# Patient Record
Sex: Male | Born: 1935 | Race: White | Hispanic: No | Marital: Married | Smoking: Former smoker
Health system: Southern US, Community
[De-identification: ages and names within clinical notes are randomized; demographics above are authoritative.]

## PROBLEM LIST (undated history)

## (undated) DIAGNOSIS — Z8639 Personal history of other endocrine, nutritional and metabolic disease: Secondary | ICD-10-CM

## (undated) DIAGNOSIS — I447 Left bundle-branch block, unspecified: Secondary | ICD-10-CM

## (undated) DIAGNOSIS — I6523 Occlusion and stenosis of bilateral carotid arteries: Secondary | ICD-10-CM

## (undated) DIAGNOSIS — I428 Other cardiomyopathies: Secondary | ICD-10-CM

## (undated) DIAGNOSIS — G629 Polyneuropathy, unspecified: Secondary | ICD-10-CM

## (undated) DIAGNOSIS — E782 Mixed hyperlipidemia: Secondary | ICD-10-CM

## (undated) DIAGNOSIS — K648 Other hemorrhoids: Secondary | ICD-10-CM

## (undated) DIAGNOSIS — Z973 Presence of spectacles and contact lenses: Secondary | ICD-10-CM

## (undated) DIAGNOSIS — M5030 Other cervical disc degeneration, unspecified cervical region: Secondary | ICD-10-CM

## (undated) DIAGNOSIS — N529 Male erectile dysfunction, unspecified: Secondary | ICD-10-CM

## (undated) DIAGNOSIS — I2510 Atherosclerotic heart disease of native coronary artery without angina pectoris: Secondary | ICD-10-CM

## (undated) DIAGNOSIS — Z7901 Long term (current) use of anticoagulants: Secondary | ICD-10-CM

## (undated) DIAGNOSIS — M1990 Unspecified osteoarthritis, unspecified site: Secondary | ICD-10-CM

## (undated) DIAGNOSIS — I10 Essential (primary) hypertension: Secondary | ICD-10-CM

## (undated) DIAGNOSIS — N401 Enlarged prostate with lower urinary tract symptoms: Secondary | ICD-10-CM

## (undated) DIAGNOSIS — E039 Hypothyroidism, unspecified: Secondary | ICD-10-CM

## (undated) DIAGNOSIS — I5022 Chronic systolic (congestive) heart failure: Secondary | ICD-10-CM

## (undated) DIAGNOSIS — Z85828 Personal history of other malignant neoplasm of skin: Secondary | ICD-10-CM

## (undated) DIAGNOSIS — M5412 Radiculopathy, cervical region: Secondary | ICD-10-CM

## (undated) DIAGNOSIS — Z87442 Personal history of urinary calculi: Secondary | ICD-10-CM

## (undated) DIAGNOSIS — H9190 Unspecified hearing loss, unspecified ear: Secondary | ICD-10-CM

## (undated) DIAGNOSIS — I480 Paroxysmal atrial fibrillation: Secondary | ICD-10-CM

## (undated) DIAGNOSIS — G4700 Insomnia, unspecified: Secondary | ICD-10-CM

## (undated) HISTORY — DX: Other cervical disc degeneration, unspecified cervical region: M50.30

## (undated) HISTORY — DX: Radiculopathy, cervical region: M54.12

## (undated) HISTORY — DX: Insomnia, unspecified: G47.00

## (undated) HISTORY — PX: HEMORRHOID SURGERY: SHX153

## (undated) HISTORY — DX: Polyneuropathy, unspecified: G62.9

## (undated) HISTORY — DX: Unspecified hearing loss, unspecified ear: H91.90

## (undated) HISTORY — DX: Essential (primary) hypertension: I10

## (undated) HISTORY — PX: HAND SURGERY: SHX662

## (undated) HISTORY — PX: TONSILLECTOMY AND ADENOIDECTOMY: SUR1326

## (undated) HISTORY — DX: Mixed hyperlipidemia: E78.2

## (undated) HISTORY — DX: Other hemorrhoids: K64.8

## (undated) HISTORY — DX: Left bundle-branch block, unspecified: I44.7

## (undated) HISTORY — PX: TOTAL KNEE ARTHROPLASTY: SHX125

## (undated) HISTORY — DX: Unspecified osteoarthritis, unspecified site: M19.90

---

## 1996-12-30 HISTORY — PX: KNEE ARTHROSCOPY: SUR90

## 1997-12-30 HISTORY — PX: LUMBAR DISC SURGERY: SHX700

## 2003-04-01 ENCOUNTER — Ambulatory Visit (HOSPITAL_COMMUNITY): Admission: RE | Admit: 2003-04-01 | Discharge: 2003-04-01 | Payer: Self-pay | Admitting: Gastroenterology

## 2003-04-01 ENCOUNTER — Encounter (INDEPENDENT_AMBULATORY_CARE_PROVIDER_SITE_OTHER): Payer: Self-pay | Admitting: Specialist

## 2003-05-19 ENCOUNTER — Encounter: Payer: Self-pay | Admitting: General Surgery

## 2003-05-19 ENCOUNTER — Encounter: Admission: RE | Admit: 2003-05-19 | Discharge: 2003-05-19 | Payer: Self-pay | Admitting: General Surgery

## 2003-05-20 ENCOUNTER — Ambulatory Visit (HOSPITAL_BASED_OUTPATIENT_CLINIC_OR_DEPARTMENT_OTHER): Admission: RE | Admit: 2003-05-20 | Discharge: 2003-05-20 | Payer: Self-pay | Admitting: General Surgery

## 2003-05-20 ENCOUNTER — Encounter (INDEPENDENT_AMBULATORY_CARE_PROVIDER_SITE_OTHER): Payer: Self-pay | Admitting: Specialist

## 2003-10-17 ENCOUNTER — Emergency Department (HOSPITAL_COMMUNITY): Admission: EM | Admit: 2003-10-17 | Discharge: 2003-10-17 | Payer: Self-pay | Admitting: Emergency Medicine

## 2006-12-16 ENCOUNTER — Inpatient Hospital Stay (HOSPITAL_COMMUNITY): Admission: RE | Admit: 2006-12-16 | Discharge: 2006-12-19 | Payer: Self-pay | Admitting: Specialist

## 2007-09-29 ENCOUNTER — Encounter: Admission: RE | Admit: 2007-09-29 | Discharge: 2007-09-29 | Payer: Self-pay | Admitting: Emergency Medicine

## 2009-10-10 ENCOUNTER — Encounter: Admission: RE | Admit: 2009-10-10 | Discharge: 2009-10-10 | Payer: Self-pay | Admitting: Emergency Medicine

## 2010-06-27 ENCOUNTER — Encounter: Admission: RE | Admit: 2010-06-27 | Discharge: 2010-06-27 | Payer: Self-pay | Admitting: Emergency Medicine

## 2011-04-23 ENCOUNTER — Ambulatory Visit
Admission: RE | Admit: 2011-04-23 | Discharge: 2011-04-23 | Disposition: A | Discharge status: Home / Self Care | Payer: Medicare Other | Source: Ambulatory Visit | Attending: Emergency Medicine | Admitting: Emergency Medicine

## 2011-04-23 ENCOUNTER — Other Ambulatory Visit: Payer: Self-pay | Admitting: Emergency Medicine

## 2011-04-23 DIAGNOSIS — M542 Cervicalgia: Secondary | ICD-10-CM

## 2011-05-17 NOTE — Op Note (Signed)
Joshua Carney, CHAPA NO.:  [id number]   MEDICAL RECORD NO.:  [id number]          PATIENT TYPE:  INP   LOCATION:  X007                         FACILITY:  [facility]   PHYSICIAN:  Erasmo Leventhal, M.D.DATE OF BIRTH:  January 14, 1934   DATE OF PROCEDURE:  12/16/2006  DATE OF DISCHARGE:                               OPERATIVE REPORT   PREOPERATIVE DIAGNOSIS:  Left knee end-stage osteoarthritis.   POSTOPERATIVE DIAGNOSIS:  Left knee end-stage osteoarthritis.   PROCEDURE:  Left total knee arthroplasty.   SURGEON:  Erasmo Leventhal, M.D.   ASSISTANT:  Jaquelyn Bitter. Chabon, PA-C.   ANESTHESIA:  Spinal.   ESTIMATED BLOOD LOSS:  Less than 100 cc.   DRAINS:  Two medium Hemovac.   COMPLICATIONS:  None.   TOURNIQUET TIME:  An hour and 30 minutes at 300 mmHg.   DISPOSITION:  PACU stable.   OPERATIVE DETAILS:  Patient counseled in the holding area.  The correct  side was identified.  IV was started.  Taken to the operating room,  where spinal anesthetic was administered.  IV antibiotics were given.  The Foley catheter was placed utilizing sterile technique by the OR  circulating nurse.  All extremities were well padded and bumped.  The  left knee was examined.  He had an 8 degree flexure contracture.  He was  in varus.  He was elevated slightly, prepped with DuraPrep, and draped  in a sterile fashion.  Exsanguinated.  Esmarch was then inflated to 300  mmHg.  A straight and midline incision was made through the skin and  subcutaneous tissue.  Medial and lateral soft tissue flaps were  developed.  Medial parapatellar arthrotomy was performed.  The patella  was retracted.  A proximal medial soft tissue release was done due to  his varus malalignment.  The knee was found to be with end-stage  arthritis changes, bone-against-bone contact.  Cruciate ligaments were  resected.  A starting hole made in the distal femur.  The canal was  irrigated, effluent was clear.   Intramedullary rod was gently placed.  I  chose a 5 degree valgus cut and took a 12 mm cut off the distal femur  due to his flexion contracture.  Distal femur was found to be a size 5.  Rotational marks were made.  A cutting block was applied.  Medial and  lateral menisci were removed under direct visualization.  Geniculate  vessels were coagulated.  Posterior neurovascular structures were thawed  off and protected throughout the entire case.  The tibial eminence was  resected.  The proximal tibial was found to be a size 4.  Central aspect  was noted.  Reamer, step reamer, and irrigated.  The intramedullary rod  was gently placed.  I chose a 10 mm cut, based upon the lateral side,  which was the least deficit with a 0 degree slope.  Posterior medial and  posterior femoral osteophytes were removed.  At this time with flexion  and extension blocks, we were tight in flexion and extension.  A tibial  guide was again applied, and another 4  mm cut.  At this time with  flexion and extension blocks were obtained, we were well-balanced in  flexion and extension.  A tibial base plate was applied.  Correct  coverage was set, rotation, reamer and punch.  A femoral box cut was  performed.  At this time, a size 5 femur, size 4 tibia with a 10 insert  with good range of motion and soft tissue balance.  The patella was  found to be a size 35.  The appropriate amount of bone was resected.  Locking holes were made.  Patella button was retracted anatomically.  All trials were now removed.  The knee was irrigated and pulsatile  lavaged.  Utilizing a modern cement technique, all components were  cemented into place.  A size 4 tibia, size 5 femur, 35 patella.  After  the cement had cured, we did remove excess cement.  We did trials of a  10 and 12.5 and with the 12.5 tibial insert rotating platform, we had  excellent flexion and extension gaps, well balanced varus and valgus  stress, and patellofemoral tracking  was anatomic.  A trial was then  removed.  The knee was again irrigated.  Tibia was subluxed forward.  The tibia was a 12.5 mm posterior stabilized rotating platform tibial  insert was implanted.  We now had a well aligned and well balanced knee.  Again irrigated.  Two medium Hemovac drains were placed.  Sequential  closure of the layers was done.  Arthrotomy with Vicryl, subcu with  Vicryl.  The skin was closed with Monocryl suture.  Steri-Strips were  applied.  Drain hooked to suction.  Sterile compressive dressing was  applied.  Normal circulation of the foot and ankle at the end of the  case.  He was then awakened.  He was then from the operating room to the  PACU in stable condition.  Sponge, needle, and instrument count correct.  No complications or problems.   To help with surgical retraction and decision-making, Mr. Joshua Able,  PA-C's, assistance, was needed throughout the entire case.           ______________________________  Erasmo Leventhal, M.D.     RAC/MEDQ  D:  12/16/2006  T:  12/16/2006  Job:  [id number]

## 2011-05-17 NOTE — Discharge Summary (Signed)
NAMEJONATHEN, Joshua Carney                ACCOUNT NO.:  [id number]   MEDICAL RECORD NO.:  [id number]          PATIENT TYPE:  INP   LOCATION:  1513                         [facility]ACILITY:  [facility]   PHYSICIAN:  Erasmo Leventhal, M.D.DATE OF BIRTH:  [birth year]   DATE OF ADMISSION:  12/16/2006  DATE OF DISCHARGE:  12/19/2006                               DISCHARGE SUMMARY   ADMITTING DIAGNOSIS:  End-stage osteoarthritis, left knee.   DISCHARGE DIAGNOSIS:  End-stage osteoarthritis, left knee.   OPERATION:  Total knee arthroplasty, left knee.   BRIEF HISTORY:  This is a 77 year old gentleman with a history of end-  stage osteoarthritis of bilateral knees, left greater than right, with  severe pain and varus deformity.  He has failed conservative measures  and after discussion of treatment options, the patient now scheduled for  total knee arthroplasty.   LABORATORY VALUES:  Admission CBC within normal limits.  Admission CMET  within normal limits with the exception of a mildly high glucose.  He  ran mildly high glucoses throughout admission, all less than 160.  His  admission PT/PTT within normal limits and admission urinalysis within  normal limits.  His hemoglobin and hematocrit reached a low of 12.9 and  36.8 on the 21st.  His white count was initially elevated  postoperatively at 24,000 and was back down to 12.1 at discharge.  There  was no evidence of infection anywhere throughout his admission.  His  BMET on the 19th was within normal limits with the exception of the  glucose at 156.  On the 20th, he had hyponatremia at 127, hypochloremia  at 93.  He was placed on fluid restrictions and on the 21st, his BMET  was back to normal with the exception of mildly elevated glucose.   COURSE IN THE HOSPITAL:  First postoperative day, the patient was  feeling good, mild pain, vital signs stable, afebrile.  INR was good.  WBC was 24.4, no evidence of infection noted anywhere.  His lungs were  clear.  Heart sounds normal.  Bowel sounds active.  Dressing dry.  Calves negative.  Drain was removed without difficulty.  Second  postoperative day, his vital signs were stable.  INR was good.  WBC was  decreased at 15.  The sodium was 127, chloride at 93.  He was placed on  fluid restrictions to correct his hyponatremia.  Otherwise, his lungs  were clear.  Heart sounds normal.  Bowel sounds sluggish.  Calves were  negative.  Dressing was changed and wound was benign.  Third  postoperative day, he was feeling much better.  His vital signs were  stable.  He was afebrile, I&O's good.  Hemoglobin 12.9, hematocrit 36.8,  WBC down to 12.1.  BMET was within normal with the exception of glucose  mildly elevated at 124.  Lungs clear.  Heart sounds normal.  Bowel  sounds active.  Calves negative.  Wound was benign.  The patient was  subsequently discharged home for followup in the office.   CONDITION ON DISCHARGE:  Improved.   DISCHARGE MEDICATIONS:  1. Percocet 5/325, 1-2 q. 4-6  h. p.r.n. pain.  2. Robaxin 500, 1 p.o. daily p.r.n. spasm.  3. Lovenox 30 mg subcu q. 8 a.m. and q. 8 p.m. x7 days.   DISCHARGE INSTRUCTIONS:  Do his home therapy.  Work on range of motion.  Use his CPM 8 hours a day.  Call the office today for recheck in 2 weeks  and call sooner p.r.n. problems.      Jaquelyn Bitter. Chabon, P.A.    ______________________________  Erasmo Leventhal, M.D.    SJC/MEDQ  D:  12/19/2006  T:  12/19/2006  Job:  [id number]

## 2011-05-17 NOTE — H&P (Signed)
Joshua Carney, Joshua Carney NO.:  [id number]   MEDICAL RECORD NO.:  [id number]           PATIENT TYPE:   LOCATION:                                 FACILITY:   PHYSICIAN:  Ellwood Dense, M.D.   DATE OF BIRTH:  10/31/1934   DATE OF ADMISSION:  12/16/2006  DATE OF DISCHARGE:                              HISTORY & PHYSICAL   CHIEF COMPLAINT:  End-stage osteoarthritis left knee.   BRIEF HISTORY:  This is a 77 year old gentleman with a history of end-  stage osteoarthritis of his left knee that has failed conservative  measures.  After discussion of treatment benefits, risks and options,  the patient is now scheduled for total knee arthroplasty of the left  knee.  He has received medical clearance from Dr. Leslee Home, his  medical doctor, and surgery will go ahead as scheduled.   DRUG ALLERGIES:  None.   CURRENT MEDICATIONS:  1. Atenolol 100 mg 1 daily.  2. Hydrochlorothiazide 25 mg 1 daily.   PREVIOUS SURGERY:  Hemorrhoidectomy, knee arthroscopy, open reduction  and internal fixation of the left fifth metacarpal and lumbar  laminectomy.   SERIOUS MEDICAL ILLNESSES:  Hypertension and diet-controlled diabetes.   FAMILY HISTORY:  Positive for cancer.   SOCIAL HISTORY:  Patient works at [employer].  He is married.  He does  not smoke and rarely drinks.   REVIEW OF SYSTEMS:  CENTRAL NERVOUS SYSTEM:  Negative for headache,  blurred vision or dizziness.  PULMONARY:  No shortness of breath, PND or  orthopnea.  CARDIOVASCULAR:  No chest pain or palpitation.  GI: Negative  for ulcers or hepatitis.  GU: Negative for urinary tract difficulty.  MUSCULOSKELETAL:  Positive as in HPI.   PHYSICAL EXAMINATION:  VITAL SIGNS:  BP 140/78, respirations 16, pulse  68 and regular.  GENERAL APPEARANCE:  This is a well-developed, well-nourished gentleman  in no acute distress.  HEENT: Head normocephalic.  Nose patent.  Ears patent.  Pupils equal,  round and reactive to light.   Throat without injection.  NECK:  Supple without adenopathy.  Carotids 2+ without bruit.  CHEST:  Clear to auscultation.  No rales or rhonchi.  Respirations 16.  HEART:  Regular rate and rhythm at 68 beats without murmur.  ABDOMEN:  Soft with active bowel sounds.  No mass or organomegaly.  NEUROLOGIC:  Patient alert and oriented to time, place and person.  Cranial nerves II-XII grossly intact.  EXTREMITIES:  Shows the left knee with a varus deformity at 5-85 degrees  range of motion.  Neurovascular status intact.  Dorsalis pedis and  posterior tibialis pulses are 2+.   X-rays show end-stage osteoarthritis of the left knee.   IMPRESSION:  End-stage osteoarthritis left knee.   PLAN:  Total knee arthroplasty left knee.      Jaquelyn Bitter. Jannette Spanner, P.A.    ______________________________  Ellwood Dense, M.D.    SJC/MEDQ  D:  12/10/2006  T:  12/10/2006  Job:  [id number]

## 2011-05-17 NOTE — Op Note (Signed)
NAME:  Joshua Carney, Joshua Carney                         ACCOUNT NO.:  [id number]   MEDICAL RECORD NO.:  [id number]                   PATIENT TYPE:  AMB   LOCATION:                                       FACILITY:  [facility]   PHYSICIAN:  Adolph Pollack, M.D.            DATE OF BIRTH:  1934/06/04   DATE OF PROCEDURE:  05/20/2003  DATE OF DISCHARGE:                                 OPERATIVE REPORT   PREOPERATIVE DIAGNOSES:  1. Anal mass.  2. Internal hemorrhoids with bleeding.   POSTOPERATIVE DIAGNOSES:  1. Anal mass.  2. Internal hemorrhoids with bleeding.   PROCEDURE:  1. Anoscopy.  2. Excision of left posterolateral anal polypoid mass.  3. Right posterolateral hemorrhoidectomy.   SURGEON:  Adolph Pollack, M.D.   ANESTHESIA:  General   INDICATIONS:  This patient is a 77 year old male with rectal bleeding who  had a colonoscopy demonstrating an anal polyp that could not be removed by  colonoscopy as well as internal hemorrhoids.  These were noted also on my  exam in the office. He now presents for the above procedure.   TECHNIQUE:  He is seen in the holding area and then brought to the operating  room and placed supine on the operating table and a general anesthetic was  administered.  He was then placed in the lithotomy position.  The perianal  area was sterilely prepped and draped.  A digital rectal exam was performed  and I could palpate the mass more toward the left posterolateral area in  this position.   I inserted an anoscope and identified the polypoid mass.  I grasped it with  the forceps and injected 0.5% Marcaine with epinephrine at the base.  Using  electrocautery I excised the mass and sent it to pathology. I did close the  defect with a single 2-0 chromic suture.   Next, I approached the right posterolateral area and noted the internal and  external hemorrhoid.  There also was more hemorrhoidal disease at the 6  o'clock position internally.  Using the 2-0  chromic suture I ligated the  hemorrhoidal pedicle near the dentate line. I then injected the local  anesthetic in the submucosal region of the hemorrhoid and then excised it  sharply.  Bleeding was controlled with cautery.  The mucosal defect was then  closed with running locking 2-0 chromic suture.  With the posterior internal  hemorrhoid, I ligated its pedicle, injected the local anesthesia  submucosally, excised it, and then closed the mucosal defect with running,  locking 2-0 chromic suture.  No other masses or significant hemorrhoidal  disease was noted.   I inspected the wound and hemostasis was adequate.  I placed some Gelfoam in  the rectum and applied a dry dressing.   He tolerated the procedure well without any apparent complications.  He was  taken to the recovery room in satisfactory condition.  Postoperative  instructions and pain medicine will be given to him.  He will be seeing me  in the office in about 2 weeks.                                               Adolph Pollack, M.D.    Kari Baars  D:  05/20/2003  T:  05/20/2003  Job:  [id number]   cc:   Anselmo Rod, M.D.  [address]  [address]  [place]  [place] [address]  Fax: [phone]   Reuben Likes, M.D.  [address]  [place]  [place] [address]  Fax: [phone]

## 2011-05-17 NOTE — Op Note (Signed)
NAME:  Joshua Carney, Joshua Carney                        ACCOUNT NO.:  [id number]   MEDICAL RECORD NO.:  [id number]                   PATIENT TYPE:  AMB   LOCATION:  ENDO                                 FACILITY:  [facility]   PHYSICIAN:  Anselmo Rod, M.D.               DATE OF BIRTH:  1934/08/06   DATE OF PROCEDURE:  04/01/2003  DATE OF DISCHARGE:                                 OPERATIVE REPORT   PROCEDURE:  Colonoscopy with random colon biopsies.   ENDOSCOPIST:  Anselmo Rod, M.D.   INSTRUMENT USED:  Olympus video colonoscope.   INDICATION FOR PROCEDURE:  A 77 year old white male with a history of rectal  bleeding and change in bowel habits.  Rule out colonic polyps, masses, etc.   PREPROCEDURE PREPARATION:  Informed consent was procured from the patient.  The patient was fasted for eight hours prior to the procedure and prepped  with a bottle of magnesium citrate and a gallon of GoLYTELY the night prior  to the procedure.   PREPROCEDURE PHYSICAL:  VITAL SIGNS:  The patient had stable vital signs.  NECK:  Supple.  CHEST:  Clear to auscultation.  S1, S2 regular.  ABDOMEN:  Soft with normal bowel sounds.   DESCRIPTION OF PROCEDURE:  The patient was placed in the left lateral  decubitus position and sedated with 50 mg of Demerol and 7.5 mg of Versed  intravenously.  Once the patient was adequately sedate and maintained on low-  flow oxygen and continuous cardiac monitoring, the Olympus video colonoscope  was advanced from the rectum to the cecum with difficulty.  There was some  residual stool in the colon.  Multiple washes were done.  Prominent internal  hemorrhoids were seen with an anal papilla.  No masses or polyps were  identified.  The procedure was completed up to the cecum.  The appendiceal  orifice and the ileocecal valve were clearly visualized and photographed.  There was erythematous change in the left colonic mucosa with overlying  exudate, and this was biopsied to rule  out colitis.   IMPRESSION:  1. Prominent nonbleeding internal hemorrhoids with a prominent anal papilla     seen on retroflexion.  2. Erythematous mucosa in the left colon with exudate, biopsied to rule out     colitis.  3. No masses or polyps seen.   RECOMMENDATIONS:  1. Await pathology results.  2.     Surgical evaluation for possible hemorrhoidectomy in the future.  3. Outpatient follow-up in the next two weeks for further recommendations.  4. A high-fiber diet with liberal fluid intake has been advocated for now.                                               Anselmo Rod, M.D.  JNM/MEDQ  D:  04/01/2003  T:  04/04/2003  Job:  [id number]   cc:   Reuben Likes, M.D.  [address]  [place]  [place] [address]  Fax: [phone]

## 2011-06-11 ENCOUNTER — Other Ambulatory Visit: Payer: Self-pay | Admitting: Dermatology

## 2012-09-16 ENCOUNTER — Ambulatory Visit
Admission: RE | Admit: 2012-09-16 | Discharge: 2012-09-16 | Disposition: A | Discharge status: Home / Self Care | Payer: Medicare Other | Source: Ambulatory Visit | Attending: Family Medicine | Admitting: Family Medicine

## 2012-09-16 ENCOUNTER — Other Ambulatory Visit: Payer: Self-pay | Admitting: Family Medicine

## 2012-09-16 DIAGNOSIS — R202 Paresthesia of skin: Secondary | ICD-10-CM

## 2012-09-16 DIAGNOSIS — R200 Anesthesia of skin: Secondary | ICD-10-CM

## 2012-10-21 LAB — PULMONARY FUNCTION TEST

## 2012-11-03 ENCOUNTER — Other Ambulatory Visit (HOSPITAL_COMMUNITY): Payer: Self-pay | Admitting: Family Medicine

## 2012-11-03 DIAGNOSIS — I447 Left bundle-branch block, unspecified: Secondary | ICD-10-CM

## 2012-11-05 ENCOUNTER — Other Ambulatory Visit (HOSPITAL_COMMUNITY): Payer: Medicare Other

## 2012-11-06 ENCOUNTER — Other Ambulatory Visit: Payer: Self-pay | Admitting: Family Medicine

## 2012-11-06 ENCOUNTER — Ambulatory Visit
Admission: RE | Admit: 2012-11-06 | Discharge: 2012-11-06 | Disposition: A | Discharge status: Home / Self Care | Payer: Medicare Other | Source: Ambulatory Visit | Attending: Family Medicine | Admitting: Family Medicine

## 2012-11-06 DIAGNOSIS — R05 Cough: Secondary | ICD-10-CM

## 2012-11-11 ENCOUNTER — Ambulatory Visit (HOSPITAL_COMMUNITY): Payer: Medicare Other | Attending: Cardiovascular Disease | Admitting: Radiology

## 2012-11-11 DIAGNOSIS — I447 Left bundle-branch block, unspecified: Secondary | ICD-10-CM

## 2012-11-11 DIAGNOSIS — I10 Essential (primary) hypertension: Secondary | ICD-10-CM | POA: Insufficient documentation

## 2012-11-11 DIAGNOSIS — E119 Type 2 diabetes mellitus without complications: Secondary | ICD-10-CM | POA: Insufficient documentation

## 2012-11-11 DIAGNOSIS — R002 Palpitations: Secondary | ICD-10-CM | POA: Insufficient documentation

## 2012-11-11 NOTE — Progress Notes (Signed)
Echocardiogram performed.  

## 2012-11-12 ENCOUNTER — Encounter (HOSPITAL_COMMUNITY): Payer: Self-pay | Admitting: Family Medicine

## 2012-12-11 ENCOUNTER — Ambulatory Visit (INDEPENDENT_AMBULATORY_CARE_PROVIDER_SITE_OTHER): Payer: Medicare Other | Admitting: Cardiology

## 2012-12-11 ENCOUNTER — Encounter: Payer: Self-pay | Admitting: Cardiology

## 2012-12-11 VITALS — BP 146/76 | HR 71 | Ht 70.0 in | Wt 199.0 lb

## 2012-12-11 DIAGNOSIS — R9431 Abnormal electrocardiogram [ECG] [EKG]: Secondary | ICD-10-CM | POA: Insufficient documentation

## 2012-12-11 DIAGNOSIS — I428 Other cardiomyopathies: Secondary | ICD-10-CM

## 2012-12-11 DIAGNOSIS — I420 Dilated cardiomyopathy: Secondary | ICD-10-CM

## 2012-12-11 MED ORDER — LOSARTAN POTASSIUM 50 MG PO TABS: 50.0000 mg | ORAL_TABLET | Freq: Two times a day (BID) | ORAL | Status: DC

## 2012-12-11 NOTE — Patient Instructions (Addendum)
Please increase your Cozaar to 50 mg twice a day Continue all other medications as listed.  Your physician has requested that you have cardiac CT. Cardiac computed tomography (CT) is a painless test that uses an x-ray machine to take clear, detailed pictures of your heart. For further information please visit [email or web address] Please follow instruction sheet as given.   Follow up with Dr Antoine Poche after testing.  Cardiomyopathy Cardiomyopathy means a disease of the heart muscle. The heart muscle becomes enlarged or stiff. The heart is not able to pump enough blood or deliver enough oxygen to the body. This leads to heart failure and is the number one reason for heart transplants.  TYPES OF CARDIOMYOPATHY INCLUDE: DILATED  The most common type. The heart muscle is stretched out and weak so there is less blood pumped out.   Some causes:  Disease of the arteries of the heart (ischemia).  Heart attack with muscle scar.  Leaky or damaged valves.  After a viral illness.  Smoking.  High cholesterol.  Diabetes or overactive thyroid.  Alcohol or drug abuse.  High blood pressure.  May be reversible. HYPERTROPHIC The heart muscle grows bigger so there is less room for blood in the ventricle, and not enough blood is pumped out.   Causes include:  Mitral valve leaks.  Inherited tendency (from your family).  No explanation (idiopathic).  May be a cause of sudden death in young athletes with no symptoms. RESTRICTIVE The heart muscle becomes stiff, but not always larger. The heart has to work harder and will get weaker. Abnormal heart beats or rhythm (arrhythmia) are common.  Some causes:  Diseases in other parts of the body which may produce abnormal deposits in the heart muscle.  Probably not inherited.  A result of radiation treatment for cancer. SYMPTOMS OF ALL TYPES:  Less able to exercise or tolerate physical activity.  Palpitations.  Irregular heart beat, heart  arrhythmias.  Shortness of breath, even at rest.  Chest pain.  Lightheadedness or fainting. TREATMENT  Life-style changes including reducing salt, lowering cholesterol, stop smoking.  Manage contributing causes with medications.  Medicines to help reduce the fluids in the body.  An implanted cardioverter defibrillator (ICD) to improve heart function and correct arrhythmias.  Medications to relax the blood vessels and make it easier for the heart to pump.  Drugs that help regulate heart beat and improve heart relaxation, reducing the work of the heart.  Myomectomy for patients with hypertrophic cardiomyopathy and severe problems. This is a surgical procedure that removes a portion of the thickened muscle wall in order to improve heart output and provide symptom relief.  A heart transplant is an option in carefully applied circumstances. SEEK IMMEDIATE MEDICAL CARE IF:   You have severe chest pain, especially if the pain is crushing or pressure-like and spreads to the arms, back, neck, or jaw, or if you have sweating, feeling sick to your stomach (nausea), or shortness of breath. THIS IS AN EMERGENCY. Do not wait to see if the pain will go away. Get medical help at once. Call your local emergency services (911 in [place] DO NOT drive yourself to the hospital.  You develop severe shortness of breath.  You begin to cough up bloody sputum.  You are unable to sleep because you cannot breathe.  You gain weight due to fluid retention.  You develop painful swelling in your calf or leg.  You feel your heart racing and it does not go away or happens when you  are resting. Document Released: 02/28/2005 Document Revised: 03/09/2012 Document Reviewed: 08/03/2008 [facility] Patient Information 2013 [place], [place].

## 2012-12-11 NOTE — Progress Notes (Signed)
HPI The patient presents for evaluation of his cardiomyopathy. He does have a history of a left bundle branch block that he thinks goes back 3 or 4 years. He has not had any prior cardiac workup. He is quite an active gentleman and actually can pedal a bicycle 20 [place] without significant difficulty. He walks routinely. He might get short of breath walking up an incline. However, this is not new. He's not describing any PND or orthopnea. He denies any chest pressure, neck or arm discomfort. He has not noticed palpitations, presyncope or syncope. Recently to evaluate his left bundle branch block he was sent for an echocardiogram which demonstrated an ejection fraction of about 40% with global hypokinesis. There were no significant wall motion abnormalities.  Of note the patient has had long-standing hypertension. He does think he probably runs on the high side and knows that stress he can be in the 170s systolic though he reports in the 120s when he is relaxed. He recently was started on losartan.   No Known Allergies  Current Outpatient Prescriptions  Medication Sig Dispense Refill  . ALPRAZolam (XANAX) 0.5 MG tablet Take 0.5 mg by mouth at bedtime as needed.      Marland Kitchen amLODipine (NORVASC) 5 MG tablet Take 5 mg by mouth daily.      Marland Kitchen aspirin 81 MG tablet Take 81 mg by mouth daily.      . B Complex Vitamins (VITAMIN-B COMPLEX PO) Take one tablet daily      . Cholecalciferol (HM VITAMIN D3) 2000 UNITS CAPS Take one tablet by mouth once daily      . Coenzyme Q10 (EQL COQ10) 300 MG CAPS Take one tablet by mouth once daily      . EVENING PRIMROSE OIL PO Take one tablet by mouth once daily      . losartan (COZAAR) 50 MG tablet Take 50 mg by mouth daily.      . Magnesium 500 MG TABS Take by mouth. Take one tablet by mouth once daily      . Tamsulosin HCl (FLOMAX) 0.4 MG CAPS Take 0.4 mg by mouth daily.        Past Medical History  Diagnosis Date  . Osteoarthritis   . Peripheral neuropathy   .  Internal hemorrhoids   . BPH (benign prostatic hyperplasia)   . Degenerative disc disease, cervical   . Left bundle branch block   . Mixed hyperlipidemia   . Left cervical radiculopathy   . Hypertension   . Hearing loss   . Insomnia     Past Surgical History  Procedure Date  . Back surgery     lumbar  . Total knee arthroplasty     left  . Hemorrhoid surgery   . Tonsillectomy and adenoidectomy   . Hand surgery     wrestling injury    Family History  Problem Relation Age of Onset  . Hypertension Mother   . Hypertension Mother     History   Social History  . Marital Status: Married    Spouse Name: N/A    Number of Children: 2  . Years of Education: N/A   Occupational History  . Not on file.   Social History Main Topics  . Smoking status: Former Smoker    Types: Cigarettes  . Smokeless tobacco: Not on file     Comment: Light distant smoking history.  . Alcohol Use: 0.5 oz/week    1 drink(s) per week  . Drug Use:  No  . Sexually Active: Not on file   Other Topics Concern  . Not on file   Social History Narrative   Three grandchildren.  Lives at home with wife.     ROS: Positive for joint pains. Otherwise as stated in the history of present illness and negative for all other systems.  PHYSICAL EXAM BP 146/76  Pulse 71  Ht 5\' 10"  (1.778 m)  Wt 199 lb (90.266 kg)  BMI 28.55 kg/m2  SpO2 95% GENERAL:  Well appearing HEENT:  Pupils equal round and reactive, fundi not visualized, oral mucosa unremarkable NECK:  No jugular venous distention, waveform within normal limits, carotid upstroke brisk and symmetric, no bruits, no thyromegaly LYMPHATICS:  No cervical, inguinal adenopathy LUNGS:  Clear to auscultation bilaterally BACK:  No CVA tenderness CHEST:  Unremarkable HEART:  PMI not displaced or sustained,S1 and S2 within normal limits, no S3, no S4, no clicks, no rubs, no murmurs ABD:  Flat, positive bowel sounds normal in frequency in pitch, no bruits, no  rebound, no guarding, no midline pulsatile mass, no hepatomegaly, no splenomegaly EXT:  2 plus pulses throughout, no edema, no cyanosis no clubbing SKIN:  No rashes no nodules NEURO:  Cranial nerves II through XII grossly intact, motor grossly intact throughout PSYCH:  Cognitively intact, oriented to person place and time   EKG:  NSR, RATE 71, LBBB.  12/11/2012   ASSESSMENT AND PLAN   Cardiomyopathy - I did review his echocardiogram I agree his ejection fraction is about percent. This may be related to long-standing hypertension. However, I need to exclude obstructive coronary disease. I would suggest cardiac catheterization versus coronary CT angiography. He would prefer coronary CT angiography. I will try to arrange this. In the meantime we will continue medical. I will increase his Cozaar to 50 mg twice a day.  Hypertension - This will be managed in the context of treating his cardiomyopathy.  Left bundle branch block - This seems to be chronic but will be evaluated as above.

## 2012-12-17 ENCOUNTER — Ambulatory Visit: Payer: Medicare Other | Admitting: Cardiology

## 2012-12-18 ENCOUNTER — Telehealth: Payer: Self-pay | Admitting: *Deleted

## 2012-12-18 NOTE — Telephone Encounter (Signed)
Mr. Melhorn insurance company has denied Cardiac CT per Charmaine.

## 2012-12-25 ENCOUNTER — Telehealth: Payer: Self-pay | Admitting: Cardiology

## 2012-12-25 NOTE — Telephone Encounter (Signed)
Joshua Carney states he received a letter from [employer] company that the ct scan was denied due to his coronary risk evaluation score.  He wants to know if another test would be an option or if Dr Antoine Poche can get the ct approved?  Please call pt and let him know.  He knows Dr Antoine Poche is off until Tuesday.

## 2012-12-25 NOTE — Telephone Encounter (Signed)
New problem:   Receive a letter from [employer] [facility] Stating that use the sympathic risk evaluation to score on bases on varies factors, age, smoking, blood test. His risk does not place him at low or moderate risk. Therefore,  unable to authorization this request.   1. Does this means he's in good &  bad shape.  Need clarification.

## 2012-12-28 NOTE — Telephone Encounter (Signed)
Dr Antoine Poche gave an order for the pt to have an outpt cardiac cath.  Discussed with pt who is uncertain he wants to do this.  He would like to know the cost of a cardiac ct and he may pay for it out of pocket.  Called and left a message for Doug in Radiology to verify cost.  Per Doug CPT [phone]  CTA Cardiac and Morphology with CA score - cost $1,058 if pt pays out of pocket up front and doesn't bill insurance he would receive a 50% discount.  Once pt decides Gala Romney asked that I call back to verify costs [phone].  Pt wants two weeks to decide whether he wants to have a cath or do the cardiac CT.

## 2013-01-04 ENCOUNTER — Encounter: Payer: Self-pay | Admitting: Cardiology

## 2013-01-04 NOTE — Telephone Encounter (Signed)
Pt aware of the information below.

## 2013-01-07 ENCOUNTER — Telehealth: Payer: Self-pay | Admitting: Cardiology

## 2013-01-07 NOTE — Telephone Encounter (Signed)
Pt wants to rsc procedure and pay cash or credit card since insurance will not pay for it

## 2013-01-07 NOTE — Telephone Encounter (Signed)
Will forward to [facility] to reschedule cardiac ct along with staff msg.

## 2013-01-11 ENCOUNTER — Telehealth: Payer: Self-pay | Admitting: Cardiology

## 2013-01-11 DIAGNOSIS — I420 Dilated cardiomyopathy: Secondary | ICD-10-CM

## 2013-01-11 DIAGNOSIS — R9431 Abnormal electrocardiogram [ECG] [EKG]: Secondary | ICD-10-CM

## 2013-01-11 DIAGNOSIS — Z01810 Encounter for preprocedural cardiovascular examination: Secondary | ICD-10-CM

## 2013-01-11 NOTE — Telephone Encounter (Signed)
Pt ready to schedule cardioversion, and possible cardiac cath, he's insurance will only cover one but pt will cover the other other , will be spending February in [facility], needs to set up both for January if possible, pls call today

## 2013-01-11 NOTE — Telephone Encounter (Signed)
Spoke to patient he stated he wants both test scheduled before 2/14.States Pam is aware of what needs to be scheduled.Message sent to [facility].

## 2013-01-12 NOTE — Telephone Encounter (Signed)
Pt now wanting both Cardiac CT and a cardiac cath completed.  Will forward to MD to verify if both are necessary.

## 2013-01-12 NOTE — Telephone Encounter (Signed)
Please see next phone note

## 2013-01-12 NOTE — Telephone Encounter (Signed)
pls see the following phone note

## 2013-01-13 NOTE — Telephone Encounter (Signed)
Will forward to [facility] RN with [facility], patient would like to get scheduled on 1/23 if possible (wants before end of month with Dr Antoine Poche)

## 2013-01-13 NOTE — Telephone Encounter (Signed)
He does not need a cath and a CT.  He needs one or the other.  His insurance company will not approve payment for a CT.  I took this through appeals and it was denied.

## 2013-01-13 NOTE — Telephone Encounter (Signed)
Patient will proceed with cardiac cath.

## 2013-01-14 ENCOUNTER — Telehealth: Payer: Self-pay | Admitting: Cardiology

## 2013-01-14 ENCOUNTER — Encounter: Payer: Self-pay | Admitting: *Deleted

## 2013-01-14 NOTE — Telephone Encounter (Signed)
Pt decided to have cardiac cath due and has been scheduled for 1/23 in the [facility].  Pt to be there 11:30 for a 12:30 case with Dr Antoine Poche.  He will come in for blood work 1/21.  Reviewed instructions with pt who will pick up a copy of instructions and directions when he comes in for lab.

## 2013-01-14 NOTE — Telephone Encounter (Signed)
New Problem:    Patient called in wanting to know what a Cardiac CT is, if he would be able to have a CT because he has a new insurance, and when his next CT appointment will be.  Please call back.

## 2013-01-14 NOTE — Telephone Encounter (Signed)
Please see telephone note from 1/13

## 2013-01-17 ENCOUNTER — Other Ambulatory Visit: Payer: Self-pay | Admitting: Cardiology

## 2013-01-17 DIAGNOSIS — I420 Dilated cardiomyopathy: Secondary | ICD-10-CM

## 2013-01-18 ENCOUNTER — Encounter: Payer: Self-pay | Admitting: *Deleted

## 2013-01-19 ENCOUNTER — Other Ambulatory Visit (INDEPENDENT_AMBULATORY_CARE_PROVIDER_SITE_OTHER): Payer: Medicare Other

## 2013-01-19 DIAGNOSIS — I420 Dilated cardiomyopathy: Secondary | ICD-10-CM

## 2013-01-19 DIAGNOSIS — Z01810 Encounter for preprocedural cardiovascular examination: Secondary | ICD-10-CM

## 2013-01-19 DIAGNOSIS — R9431 Abnormal electrocardiogram [ECG] [EKG]: Secondary | ICD-10-CM

## 2013-01-19 DIAGNOSIS — I428 Other cardiomyopathies: Secondary | ICD-10-CM

## 2013-01-19 LAB — CBC
HCT: 45.4 % (ref 39.0–52.0)
Hemoglobin: 15.4 g/dL (ref 13.0–17.0)
MCV: 90.6 fl (ref 78.0–100.0)
RDW: 13.5 % (ref 11.5–14.6)
WBC: 6.9 10*3/uL (ref 4.5–10.5)

## 2013-01-19 LAB — BASIC METABOLIC PANEL
CO2: 27 mEq/L (ref 19–32)
Calcium: 9.1 mg/dL (ref 8.4–10.5)
Creatinine, Ser: 0.6 mg/dL (ref 0.4–1.5)
GFR: 139.08 mL/min (ref >60.00)
Glucose, Bld: 115 mg/dL — ABNORMAL HIGH (ref 70–99)

## 2013-01-20 ENCOUNTER — Encounter (HOSPITAL_COMMUNITY): Payer: Self-pay | Admitting: Pharmacy Technician

## 2013-01-21 ENCOUNTER — Ambulatory Visit (HOSPITAL_COMMUNITY)
Admission: RE | Admit: 2013-01-21 | Discharge: 2013-01-21 | Disposition: A | Discharge status: Home / Self Care | Payer: Medicare Other | Source: Ambulatory Visit | Attending: Cardiology | Admitting: Cardiology

## 2013-01-21 ENCOUNTER — Encounter (HOSPITAL_BASED_OUTPATIENT_CLINIC_OR_DEPARTMENT_OTHER): Admission: RE | Payer: Self-pay | Source: Ambulatory Visit | Attending: Cardiology

## 2013-01-21 ENCOUNTER — Inpatient Hospital Stay (HOSPITAL_BASED_OUTPATIENT_CLINIC_OR_DEPARTMENT_OTHER)
Admission: RE | Admit: 2013-01-21 | Discharge: 2013-01-21 | Payer: Medicare Other | Source: Ambulatory Visit | Attending: Cardiology | Admitting: Cardiology

## 2013-01-21 ENCOUNTER — Encounter (HOSPITAL_COMMUNITY)
Admission: RE | Disposition: A | Discharge status: Home / Self Care | Payer: Self-pay | Source: Ambulatory Visit | Attending: Cardiology

## 2013-01-21 DIAGNOSIS — I428 Other cardiomyopathies: Secondary | ICD-10-CM | POA: Insufficient documentation

## 2013-01-21 DIAGNOSIS — I2510 Atherosclerotic heart disease of native coronary artery without angina pectoris: Secondary | ICD-10-CM | POA: Insufficient documentation

## 2013-01-21 DIAGNOSIS — I420 Dilated cardiomyopathy: Secondary | ICD-10-CM

## 2013-01-21 DIAGNOSIS — I10 Essential (primary) hypertension: Secondary | ICD-10-CM | POA: Insufficient documentation

## 2013-01-21 DIAGNOSIS — R9389 Abnormal findings on diagnostic imaging of other specified body structures: Secondary | ICD-10-CM | POA: Insufficient documentation

## 2013-01-21 HISTORY — PX: LEFT HEART CATHETERIZATION WITH CORONARY ANGIOGRAM: SHX5451

## 2013-01-21 SURGERY — LEFT HEART CATHETERIZATION WITH CORONARY ANGIOGRAM
Anesthesia: LOCAL

## 2013-01-21 SURGERY — JV LEFT AND RIGHT HEART CATHETERIZATION WITH CORONARY/GRAFT ANGIOGRAM
Anesthesia: Moderate Sedation

## 2013-01-21 MED ORDER — SODIUM CHLORIDE 0.9 % IV SOLN
INTRAVENOUS | Status: DC
  Administered 2013-01-21: 10:00:00 via INTRAVENOUS

## 2013-01-21 MED ORDER — HEPARIN SODIUM (PORCINE) 1000 UNIT/ML IJ SOLN
INTRAMUSCULAR | Status: AC
  Filled 2013-01-21: qty 1

## 2013-01-21 MED ORDER — SODIUM CHLORIDE 0.9 % IV SOLN: 250.0000 mL | INTRAVENOUS | Status: DC | PRN

## 2013-01-21 MED ORDER — ASPIRIN 81 MG PO CHEW
324.0000 mg | CHEWABLE_TABLET | ORAL | Status: AC
  Administered 2013-01-21: 324 mg via ORAL
  Filled 2013-01-21: qty 4

## 2013-01-21 MED ORDER — VERAPAMIL HCL 2.5 MG/ML IV SOLN
INTRAVENOUS | Status: AC
  Filled 2013-01-21: qty 2

## 2013-01-21 MED ORDER — MIDAZOLAM HCL 2 MG/2ML IJ SOLN
INTRAMUSCULAR | Status: AC
  Filled 2013-01-21: qty 2

## 2013-01-21 MED ORDER — SODIUM CHLORIDE 0.9 % IJ SOLN
3.0000 mL | INTRAMUSCULAR | Status: DC | PRN
Start: 2013-01-21 — End: 2013-01-21

## 2013-01-21 MED ORDER — HEPARIN (PORCINE) IN NACL 2-0.9 UNIT/ML-% IJ SOLN
INTRAMUSCULAR | Status: AC
  Filled 2013-01-21: qty 1000

## 2013-01-21 MED ORDER — ACETAMINOPHEN 325 MG PO TABS: 650.0000 mg | ORAL_TABLET | ORAL | Status: DC | PRN

## 2013-01-21 MED ORDER — SODIUM CHLORIDE 0.9 % IJ SOLN: 3.0000 mL | Freq: Two times a day (BID) | INTRAMUSCULAR | Status: DC

## 2013-01-21 MED ORDER — SODIUM CHLORIDE 0.9 % IV SOLN: INTRAVENOUS | Status: DC

## 2013-01-21 MED ORDER — LIDOCAINE HCL (PF) 1 % IJ SOLN
INTRAMUSCULAR | Status: AC
  Filled 2013-01-21: qty 30

## 2013-01-21 MED ORDER — ONDANSETRON HCL 4 MG/2ML IJ SOLN: 4.0000 mg | Freq: Four times a day (QID) | INTRAMUSCULAR | Status: DC | PRN

## 2013-01-21 NOTE — H&P (Signed)
HPI  The patient presents for evaluation of his cardiomyopathy. He does have a history of a left bundle branch block that he thinks goes back 3 or 4 years. He has not had any prior cardiac workup. He is quite an active gentleman and actually can pedal a bicycle 20 Doo without significant difficulty. He walks routinely. He might get short of breath walking up an incline. However, this is not new. He's not describing any PND or orthopnea. He denies any chest pressure, neck or arm discomfort. He has not noticed palpitations, presyncope or syncope. Recently to evaluate his left bundle branch block he was sent for an echocardiogram which demonstrated an ejection fraction of about 40% with global hypokinesis. There were no significant wall motion abnormalities.  Of note the patient has had long-standing hypertension. He does think he probably runs on the high side and knows that stress he can be in the 170s systolic though he reports in the 120s when he is relaxed. He recently was started on losartan.    No Known Allergies  Current Outpatient Prescriptions   Medication  Sig  Dispense  Refill   .  ALPRAZolam (XANAX) 0.5 MG tablet  Take 0.5 mg by mouth at bedtime as needed.     Marland Kitchen  amLODipine (NORVASC) 5 MG tablet  Take 5 mg by mouth daily.     Marland Kitchen  aspirin 81 MG tablet  Take 81 mg by mouth daily.     .  B Complex Vitamins (VITAMIN-B COMPLEX PO)  Take one tablet daily     .  Cholecalciferol (HM VITAMIN D3) 2000 UNITS CAPS  Take one tablet by mouth once daily     .  Coenzyme Q10 (EQL COQ10) 300 MG CAPS  Take one tablet by mouth once daily     .  EVENING PRIMROSE OIL PO  Take one tablet by mouth once daily     .  losartan (COZAAR) 50 MG tablet  Take 50 mg by mouth daily.     .  Magnesium 500 MG TABS  Take by mouth. Take one tablet by mouth once daily     .  Tamsulosin HCl (FLOMAX) 0.4 MG CAPS  Take 0.4 mg by mouth daily.      Past Medical History   Diagnosis  Date   .  Osteoarthritis    .  Peripheral  neuropathy    .  Internal hemorrhoids    .  BPH (benign prostatic hyperplasia)    .  Degenerative disc disease, cervical    .  Left bundle branch block    .  Mixed hyperlipidemia    .  Left cervical radiculopathy    .  Hypertension    .  Hearing loss    .  Insomnia     Past Surgical History   Procedure  Date   .  Back surgery      lumbar   .  Total knee arthroplasty      left   .  Hemorrhoid surgery    .  Tonsillectomy and adenoidectomy    .  Hand surgery      wrestling injury    Family History   Problem  Relation  Age of Onset   .  Hypertension  Mother    .  Hypertension  Mother     History    Social History   .  Marital Status:  Married     Spouse Name:  N/A  Number of Children:  2   .  Years of Education:  N/A    Occupational History   .  Not on file.    Social History Main Topics   .  Smoking status:  Former Smoker     Types:  Cigarettes   .  Smokeless tobacco:  Not on file      Comment: Light distant smoking history.   .  Alcohol Use:  0.5 oz/week     1 drink(s) per week   .  Drug Use:  No   .  Sexually Active:  Not on file    Other Topics  Concern   .  Not on file    Social History Narrative    Three grandchildren. Lives at home with wife.    ROS: Positive for joint pains. Otherwise as stated in the history of present illness and negative for all other systems.  PHYSICAL EXAM  BP 146/76  Pulse 71  Ht 5\' 10"  (1.778 m)  Wt 199 lb (90.266 kg)  BMI 28.55 kg/m2  SpO2 95%  GENERAL: Well appearing  LUNGS: Clear to auscultation bilaterally  BACK: No CVA tenderness  CHEST: Unremarkable  HEART: PMI not displaced or sustained,S1 and S2 within normal limits, no S3, no S4, no clicks, no rubs, no murmurs  ABD: Flat, positive bowel sounds normal in frequency in pitch, no bruits, no rebound, no guarding, no midline pulsatile mass, no hepatomegaly, no splenomegaly  EXT: 2 plus pulses throughout, no edema, no cyanosis no clubbing    ASSESSMENT AND PLAN    Cardiomyopathy -  I did review his echocardiogram I agree his ejection fraction is about 40 percent. This may be related to long-standing hypertension. He will have a cath today to rule out obstructive CAD.    Hypertension -  This will be managed in the context of treating his cardiomyopathy.

## 2013-01-21 NOTE — CV Procedure (Signed)
  Cardiac Catheterization Procedure Note  Name: Joshua Carney MRN: [id number] DOB: [birth year]  Procedure: Left Heart Cath, Selective Coronary Angiography, LV angiography  Indication:    Procedural details: The right groin was prepped, draped, and anesthetized with 1% lidocaine. Using modified Seldinger technique, a 5 French sheath was introduced into the right femoral artery. Standard Judkins catheters were used for coronary angiography and left ventriculography. Catheter exchanges were performed over a guidewire. There were no immediate procedural complications. The patient was transferred to the post catheterization recovery area for further monitoring.  I did attempt a radial approach but was unable to advance the guidewire.  Procedural Findings:   Hemodynamics:     AO 134/61    LV 130/1   Coronary angiography:   Coronary dominance: Right  Left mainstem:  Luminal irregularities  Left anterior descending (LAD):   Proximal tandem 25% stenosis.  Moderate mid calcification. Mid diagonal moderate sized with ostial 25% stenosis.  Second diagonal with ostial 30% stenosis.  Apical diffuse obstructive disease.    Left circumflex (LCx):  Large RI with long proximal 50% stenosis and focal 60% stenosis. AV groove 95% stenosis leading in to a small to moderate sized OM  Right coronary artery (RCA):  Large and dominant.  Proximal 30%.  Diffuse distal moderate calcification.  PDA large with ostial 30% and mid 30% stenosis.    Left ventriculography: Left ventricular systolic function is normal, LVEF is estimated at 55% with mild inferior hypokinesis, there is no significant mitral regurgitation   Final Conclusions:  Single vessel high grade CAD in a moderate to small OM with diffuse small vessel or moderate nonobstructive large vessel disease elsewhere.  Recommendations: Plan aggressive risk reduction.  The patient is currently not having symptoms.  The high grade OM stenosis is in a small to  moderate vessel.  I will likely further risk stratify with a stress perfusion study prior to releasing him to the exercise regimen that he would like to keep up.    Rollene Rotunda 01/21/2013, 1:33 PM

## 2013-01-25 ENCOUNTER — Telehealth: Payer: Self-pay | Admitting: Cardiology

## 2013-01-25 NOTE — Telephone Encounter (Signed)
Pt concerned about any restrictions after having had a recent cath and what he is able to do now.  Reviewed with pt OK to resume normal activities.  He states understanding.  Of Note he is getting [place] beach [place] and will need to follow up prior to this.  An appointment is given to him for Friday 01/29/2013 at 9:45 to look at cath site and discuss any treatment.

## 2013-01-25 NOTE — Telephone Encounter (Signed)
Pt rtn your call

## 2013-01-25 NOTE — Telephone Encounter (Signed)
Left message for pt to call back to discuss concerns.

## 2013-01-25 NOTE — Telephone Encounter (Signed)
Pt has questions regarding his limitations on his life style since his procedure

## 2013-01-27 ENCOUNTER — Encounter: Payer: Self-pay | Admitting: Cardiology

## 2013-01-27 ENCOUNTER — Ambulatory Visit (INDEPENDENT_AMBULATORY_CARE_PROVIDER_SITE_OTHER): Payer: Medicare Other | Admitting: Cardiology

## 2013-01-27 VITALS — BP 145/75 | HR 88 | Ht 70.0 in | Wt 203.0 lb

## 2013-01-27 DIAGNOSIS — R9431 Abnormal electrocardiogram [ECG] [EKG]: Secondary | ICD-10-CM

## 2013-01-27 DIAGNOSIS — I428 Other cardiomyopathies: Secondary | ICD-10-CM

## 2013-01-27 MED ORDER — ATORVASTATIN CALCIUM 40 MG PO TABS: 40.0000 mg | ORAL_TABLET | Freq: Every day | ORAL | Status: DC

## 2013-01-27 MED ORDER — AMLODIPINE BESYLATE 2.5 MG PO TABS: 2.5000 mg | ORAL_TABLET | Freq: Every day | ORAL | Status: AC

## 2013-01-27 NOTE — Progress Notes (Signed)
HPI The patient presents for followup after cardiac catheterization. He had a cardiomyopathy with left bundle branch block. Cath demonstrated 25% LAD stenosis, 25% diagonal stenosis, 50% ramus intermediate stenosis, 95% stenosis in the AV groove leading into a possibly moderate sized marginal. The right coronary artery proximal 30% stenosis. The PDA had ostial 30% stenosis. The EF appeared to be about 55%.  He had no problems following the catheterization. He does want to exercise including some 20 mile bike rides. He is currently not getting any chest discomfort, neck or arm discomfort. He is currently not getting any palpitations, presyncope or syncope. He has no shortness of breath, PND or orthopnea. However, he has not been doing any of his more aggressive bike rides.  No Known Allergies  Current Outpatient Prescriptions  Medication Sig Dispense Refill  . ALPRAZolam (XANAX) 0.5 MG tablet Take 0.5 mg by mouth at bedtime as needed. For sleep      . amLODipine-benazepril (LOTREL) 5-20 MG per capsule Take 1 capsule by mouth daily.      Marland Kitchen aspirin EC 81 MG tablet Take 81 mg by mouth at bedtime.      . B Complex Vitamins (B COMPLEX 100 PO) Take 100 mg by mouth daily.      . Cholecalciferol (HM VITAMIN D3) 2000 UNITS CAPS Take 2,000 Units by mouth daily.       . Coenzyme Q10 (EQL COQ10) 300 MG CAPS Take one tablet by mouth once daily      . Evening Primrose Oil 1000 MG CAPS Take 1,000 mg by mouth daily.      Marland Kitchen losartan (COZAAR) 50 MG tablet Take 50 mg by mouth 2 (two) times daily.      . Magnesium 500 MG TABS Take 500 mg by mouth daily.       . Tamsulosin HCl (FLOMAX) 0.4 MG CAPS Take 0.4 mg by mouth daily.        Past Medical History  Diagnosis Date  . Osteoarthritis   . Peripheral neuropathy   . Internal hemorrhoids   . BPH (benign prostatic hyperplasia)   . Degenerative disc disease, cervical   . Left bundle branch block   . Mixed hyperlipidemia   . Left cervical radiculopathy   .  Hypertension   . Hearing loss   . Insomnia     Past Surgical History  Procedure Date  . Back surgery     lumbar  . Total knee arthroplasty     left  . Hemorrhoid surgery   . Tonsillectomy and adenoidectomy   . Hand surgery     wrestling injury    ROS:  As stated in the HPI and negative for all other systems.  PHYSICAL EXAM BP 145/75  Pulse 88  Ht 5\' 10"  (1.778 m)  Wt 203 lb (92.08 kg)  BMI 29.13 kg/m2 GENERAL:  Well appearing NECK:  No jugular venous distention, waveform within normal limits, carotid upstroke brisk and symmetric, no bruits, no thyromegaly LUNGS:  Clear to auscultation bilaterally BACK:  No CVA tenderness CHEST:  Unremarkable HEART:  PMI not displaced or sustained,S1 and S2 within normal limits, no S3, no S4, no clicks, no rubs, no murmurs ABD:  Flat, positive bowel sounds normal in frequency in pitch, no bruits, no rebound, no guarding, no midline pulsatile mass, no hepatomegaly, no splenomegaly EXT:  2 plus pulses throughout, no edema, no cyanosis no clubbing, right radial access site without bruising or bleeding. Right groin without pulsatile mass or bruit.  ASSESSMENT AND PLAN  CAD - I do plan on stress perfusion imaging in the future to risk stratify as this would guide further medical management versus intervention. I think this is prudent given the fact that he would occasionally want to be on long bike rides and do more aggressive exercise.  Cardiomyopathy -  He has only a mild cardiomyopathy and this was managed in the context of treating his hypertension.  Hypertension -  I will add Norvasc 2.5 mg to his existing regimen.  Hyperlipidemia - Given his known coronary disease on going to start him on moderate dose of statin per current guidelines. I will have followup per Carolyne Fiscal, MD

## 2013-01-27 NOTE — Patient Instructions (Addendum)
Please add Amlodipine 2.5 mg and start Lipitor 40 mg a day  Please have fasting lipid and liver profile in 8 weeks at your primary care MD's office.  Your physician has requested that you have a lexiscan myoview [place]. For further information please visit [email or web address] Please follow instruction sheet, as given.  Follow up with Dr Antoine Poche after your testing.

## 2013-01-29 ENCOUNTER — Ambulatory Visit: Payer: Medicare Other | Admitting: Cardiology

## 2013-03-02 ENCOUNTER — Other Ambulatory Visit: Payer: Self-pay | Admitting: *Deleted

## 2013-03-03 ENCOUNTER — Encounter (HOSPITAL_COMMUNITY): Payer: Medicare Other

## 2013-03-08 ENCOUNTER — Ambulatory Visit: Payer: Medicare Other | Admitting: Cardiology

## 2013-03-30 ENCOUNTER — Encounter: Payer: Self-pay | Admitting: Cardiology

## 2013-04-05 ENCOUNTER — Encounter: Payer: Medicare Other | Admitting: Cardiology

## 2013-04-05 ENCOUNTER — Encounter (HOSPITAL_COMMUNITY): Payer: Medicare Other

## 2013-04-19 ENCOUNTER — Ambulatory Visit (HOSPITAL_COMMUNITY): Payer: Medicare Other | Attending: Internal Medicine | Admitting: Radiology

## 2013-04-19 VITALS — BP 137/72 | HR 65 | Ht 70.0 in | Wt 202.0 lb

## 2013-04-19 DIAGNOSIS — I447 Left bundle-branch block, unspecified: Secondary | ICD-10-CM

## 2013-04-19 DIAGNOSIS — I10 Essential (primary) hypertension: Secondary | ICD-10-CM | POA: Insufficient documentation

## 2013-04-19 DIAGNOSIS — R9431 Abnormal electrocardiogram [ECG] [EKG]: Secondary | ICD-10-CM

## 2013-04-19 DIAGNOSIS — R0609 Other forms of dyspnea: Secondary | ICD-10-CM | POA: Insufficient documentation

## 2013-04-19 DIAGNOSIS — I2510 Atherosclerotic heart disease of native coronary artery without angina pectoris: Secondary | ICD-10-CM

## 2013-04-19 DIAGNOSIS — Z87891 Personal history of nicotine dependence: Secondary | ICD-10-CM | POA: Insufficient documentation

## 2013-04-19 DIAGNOSIS — I428 Other cardiomyopathies: Secondary | ICD-10-CM

## 2013-04-19 DIAGNOSIS — E119 Type 2 diabetes mellitus without complications: Secondary | ICD-10-CM | POA: Insufficient documentation

## 2013-04-19 DIAGNOSIS — R0602 Shortness of breath: Secondary | ICD-10-CM | POA: Insufficient documentation

## 2013-04-19 DIAGNOSIS — R0989 Other specified symptoms and signs involving the circulatory and respiratory systems: Secondary | ICD-10-CM | POA: Insufficient documentation

## 2013-04-19 DIAGNOSIS — E785 Hyperlipidemia, unspecified: Secondary | ICD-10-CM | POA: Insufficient documentation

## 2013-04-19 MED ORDER — ADENOSINE (DIAGNOSTIC) 3 MG/ML IV SOLN
0.5600 mg/kg | Freq: Once | INTRAVENOUS | Status: AC
  Administered 2013-04-19: 51.3 mg via INTRAVENOUS

## 2013-04-19 MED ORDER — TECHNETIUM TC 99M SESTAMIBI GENERIC - CARDIOLITE
30.0000 | Freq: Once | INTRAVENOUS | Status: AC | PRN
  Administered 2013-04-19: 30 via INTRAVENOUS

## 2013-04-19 MED ORDER — TECHNETIUM TC 99M SESTAMIBI GENERIC - CARDIOLITE
10.0000 | Freq: Once | INTRAVENOUS | Status: AC | PRN
  Administered 2013-04-19: 10 via INTRAVENOUS

## 2013-04-19 NOTE — Progress Notes (Signed)
[facility] SITE 3 NUCLEAR MED [address] [place], [place] [address] [phone]    Cardiology Nuclear Med Study  Joshua Carney is a 79 y.o. male     MRN : [id number]     DOB: 02/09/1934  Procedure Date: 04/19/2013  Nuclear Med Background Indication for Stress Test:  Evaluation for ischemic burden for medical management versus intervention prior to patient being released for exercise regimen  History:  11/13 Echo:EF=40%; 01/21/13 Cath:MVD, mostly n/o, EF=55% Cardiac Risk Factors: History of Smoking, Hypertension, LBBB, Lipids and NIDDM  Symptoms:  DOE, SOB and Decreased Energy   Nuclear Pre-Procedure Caffeine/Decaff Intake:  None > 12 hrs NPO After: 11:00pm   Lungs:  Clear. O2 Sat: 98% on room air. IV 0.9% NS with Angio Cath:  20g  IV Site: R Antecubital x 1, tolerated well IV Started by:  Irean Hong, RN  Chest Size (in):  46 Cup Size: n/a  Height: 5\' 10"  (1.778 m)  Weight:  202 lb (91.627 kg)  BMI:  Body mass index is 28.98 kg/(m^2). Tech Comments:  Patient took morning medications    Nuclear Med Study 1 or 2 day study: 1 day  Stress Test Type:  Adenosine  Reading MD: Dietrich Pates, MD  Order Authorizing Provider:  Rollene Rotunda, MD  Resting Radionuclide: Technetium 71m Sestamibi  Resting Radionuclide Dose: 11.0 mCi   Stress Radionuclide:  Technetium 22m Sestamibi  Stress Radionuclide Dose: 33.0 mCi           Stress Protocol Rest HR: 65 Stress HR: 77  Rest BP: 137/72 Stress BP: 136/50  Exercise Time (min): n/a METS: n/a   Predicted Max HR: 144 bpm % Max HR: 53.47 bpm Rate Pressure Product: 21308   Dose of Adenosine (mg):  51.4 Dose of Lexiscan: n/a mg  Dose of Atropine (mg): n/a Dose of Dobutamine: n/a mcg/kg/min (at max HR)  Stress Test Technologist: Smiley Houseman, CMA-N  Nuclear Technologist:  Domenic Polite, CNMT     Rest Procedure:  Myocardial perfusion imaging was performed at rest 45 minutes following the intravenous administration of  Technetium 23m Sestamibi.  Rest ECG: NSR  65 bpm.  LBBB>  Stress Procedure:  The patient received IV adenosine at 140 mcg/kg/min for 4 minutes.  Technetium 39m Sestamibi was injected at the 2 minute mark and quantitative spect images were obtained after a 45 minute delay.  Stress ECG: Uninteretable due to baseline LBBB  QPS Raw Data Images:  Soft tissue (diaphragm, bowel activity) underlie heart. Stress Images:  Normal homogeneous uptake in all areas of the myocardium. Rest Images:  Normal homogeneous uptake in all areas of the myocardium. Subtraction (SDS):  No evidence of ischemia. Transient Ischemic Dilatation (Normal <1.22):  1.00 Lung/Heart Ratio (Normal <0.45):  0.23  Quantitative Gated Spect Images QGS EDV:  130 ml QGS ESV:  50 ml  Impression Exercise Capacity:  Adenosine study with no exercise. BP Response:  Normal blood pressure response. Clinical Symptoms:  Mild chest pain/dyspnea. ECG Impression:  Nondiagnostic due to LBBB> Comparison with Prior Nuclear Study: No previous nuclear study performed  Overall Impression:  Normal stress nuclear study.  LV Ejection Fraction: 61%.  LV Wall Motion:  NL LV Function; NL Wall Motion  Dietrich Pates

## 2013-05-06 ENCOUNTER — Telehealth: Payer: Self-pay | Admitting: Cardiology

## 2013-05-06 NOTE — Telephone Encounter (Signed)
New problem   Pt is wanting to know the results of stress test. Please call pt.

## 2013-05-06 NOTE — Telephone Encounter (Signed)
Pt aware of results of lexiscan

## 2013-05-06 NOTE — Telephone Encounter (Signed)
Left message for pt to call back to discuss stress test results.

## 2013-06-22 ENCOUNTER — Other Ambulatory Visit: Payer: Self-pay | Admitting: Cardiology

## 2013-11-17 ENCOUNTER — Other Ambulatory Visit: Payer: Self-pay | Admitting: Family Medicine

## 2013-11-17 DIAGNOSIS — N5089 Other specified disorders of the male genital organs: Secondary | ICD-10-CM

## 2013-11-18 ENCOUNTER — Ambulatory Visit
Admission: RE | Admit: 2013-11-18 | Discharge: 2013-11-18 | Disposition: A | Discharge status: Home / Self Care | Payer: Medicare Other | Source: Ambulatory Visit | Attending: Family Medicine | Admitting: Family Medicine

## 2013-11-18 ENCOUNTER — Other Ambulatory Visit: Payer: Medicare Other

## 2013-11-18 DIAGNOSIS — N5089 Other specified disorders of the male genital organs: Secondary | ICD-10-CM

## 2013-11-23 ENCOUNTER — Other Ambulatory Visit: Payer: Self-pay | Admitting: Cardiology

## 2013-12-27 ENCOUNTER — Other Ambulatory Visit: Payer: Self-pay | Admitting: *Deleted

## 2013-12-27 MED ORDER — LOSARTAN POTASSIUM 100 MG PO TABS: 50.0000 mg | ORAL_TABLET | Freq: Two times a day (BID) | ORAL | Status: DC

## 2014-07-26 ENCOUNTER — Other Ambulatory Visit: Payer: Self-pay | Admitting: Family Medicine

## 2014-12-08 ENCOUNTER — Encounter (HOSPITAL_COMMUNITY): Payer: Self-pay | Admitting: Cardiology

## 2014-12-16 ENCOUNTER — Other Ambulatory Visit: Payer: Self-pay | Admitting: Family Medicine

## 2014-12-16 DIAGNOSIS — H538 Other visual disturbances: Secondary | ICD-10-CM

## 2014-12-16 DIAGNOSIS — R270 Ataxia, unspecified: Secondary | ICD-10-CM

## 2014-12-19 ENCOUNTER — Ambulatory Visit
Admission: RE | Admit: 2014-12-19 | Discharge: 2014-12-19 | Disposition: A | Discharge status: Home / Self Care | Payer: 59 | Source: Ambulatory Visit | Attending: Family Medicine | Admitting: Family Medicine

## 2014-12-19 DIAGNOSIS — H538 Other visual disturbances: Secondary | ICD-10-CM

## 2014-12-19 DIAGNOSIS — R270 Ataxia, unspecified: Secondary | ICD-10-CM

## 2014-12-19 MED ORDER — GADOBENATE DIMEGLUMINE 529 MG/ML IV SOLN
17.0000 mL | Freq: Once | INTRAVENOUS | Status: AC | PRN
  Administered 2014-12-19: 17 mL via INTRAVENOUS

## 2014-12-21 ENCOUNTER — Other Ambulatory Visit (HOSPITAL_COMMUNITY): Payer: Self-pay | Admitting: Family Medicine

## 2014-12-21 ENCOUNTER — Ambulatory Visit (HOSPITAL_COMMUNITY)
Admission: RE | Admit: 2014-12-21 | Discharge: 2014-12-21 | Disposition: A | Discharge status: Home / Self Care | Payer: Medicare Other | Source: Ambulatory Visit | Attending: Vascular Surgery | Admitting: Vascular Surgery

## 2014-12-21 DIAGNOSIS — R270 Ataxia, unspecified: Secondary | ICD-10-CM | POA: Insufficient documentation

## 2014-12-21 DIAGNOSIS — G459 Transient cerebral ischemic attack, unspecified: Secondary | ICD-10-CM

## 2015-05-03 ENCOUNTER — Ambulatory Visit
Admission: RE | Admit: 2015-05-03 | Discharge: 2015-05-03 | Disposition: A | Discharge status: Home / Self Care | Payer: PRIVATE HEALTH INSURANCE | Source: Ambulatory Visit | Attending: Family Medicine | Admitting: Family Medicine

## 2015-05-03 ENCOUNTER — Other Ambulatory Visit: Payer: Self-pay | Admitting: Family Medicine

## 2015-05-03 DIAGNOSIS — T1490XA Injury, unspecified, initial encounter: Secondary | ICD-10-CM

## 2015-12-31 HISTORY — PX: CATARACT EXTRACTION W/ INTRAOCULAR LENS IMPLANT: SHX1309

## 2017-01-08 ENCOUNTER — Telehealth: Payer: Self-pay | Admitting: Internal Medicine

## 2017-01-08 NOTE — Telephone Encounter (Signed)
New Message   Dr. Sandi Mariscal needing a couple of EKGs read on pt. Will be faxing them over.

## 2017-01-08 NOTE — Telephone Encounter (Signed)
EKG received and Dr Rayann Heman spoke with MD

## 2017-03-30 DIAGNOSIS — I480 Paroxysmal atrial fibrillation: Secondary | ICD-10-CM

## 2017-03-30 HISTORY — DX: Paroxysmal atrial fibrillation: I48.0

## 2017-04-28 ENCOUNTER — Encounter (HOSPITAL_COMMUNITY): Payer: Self-pay | Admitting: *Deleted

## 2017-04-28 ENCOUNTER — Observation Stay (HOSPITAL_COMMUNITY)
Admission: EM | Admit: 2017-04-28 | Discharge: 2017-04-29 | Disposition: A | Discharge status: Home / Self Care | Payer: Medicare Other | Attending: Internal Medicine | Admitting: Internal Medicine

## 2017-04-28 ENCOUNTER — Emergency Department (HOSPITAL_COMMUNITY): Payer: Medicare Other

## 2017-04-28 ENCOUNTER — Observation Stay (HOSPITAL_COMMUNITY): Payer: Medicare Other

## 2017-04-28 DIAGNOSIS — N400 Enlarged prostate without lower urinary tract symptoms: Secondary | ICD-10-CM | POA: Diagnosis present

## 2017-04-28 DIAGNOSIS — Z7982 Long term (current) use of aspirin: Secondary | ICD-10-CM | POA: Insufficient documentation

## 2017-04-28 DIAGNOSIS — H538 Other visual disturbances: Secondary | ICD-10-CM | POA: Diagnosis not present

## 2017-04-28 DIAGNOSIS — N401 Enlarged prostate with lower urinary tract symptoms: Secondary | ICD-10-CM | POA: Diagnosis not present

## 2017-04-28 DIAGNOSIS — E782 Mixed hyperlipidemia: Secondary | ICD-10-CM | POA: Diagnosis not present

## 2017-04-28 DIAGNOSIS — R000 Tachycardia, unspecified: Secondary | ICD-10-CM | POA: Diagnosis present

## 2017-04-28 DIAGNOSIS — I4891 Unspecified atrial fibrillation: Secondary | ICD-10-CM | POA: Diagnosis not present

## 2017-04-28 DIAGNOSIS — Z79899 Other long term (current) drug therapy: Secondary | ICD-10-CM | POA: Insufficient documentation

## 2017-04-28 DIAGNOSIS — I10 Essential (primary) hypertension: Secondary | ICD-10-CM | POA: Diagnosis present

## 2017-04-28 DIAGNOSIS — E039 Hypothyroidism, unspecified: Secondary | ICD-10-CM | POA: Diagnosis not present

## 2017-04-28 DIAGNOSIS — I447 Left bundle-branch block, unspecified: Secondary | ICD-10-CM | POA: Diagnosis not present

## 2017-04-28 DIAGNOSIS — Z96652 Presence of left artificial knee joint: Secondary | ICD-10-CM | POA: Diagnosis not present

## 2017-04-28 DIAGNOSIS — I5022 Chronic systolic (congestive) heart failure: Secondary | ICD-10-CM | POA: Diagnosis not present

## 2017-04-28 DIAGNOSIS — Z87891 Personal history of nicotine dependence: Secondary | ICD-10-CM | POA: Diagnosis not present

## 2017-04-28 DIAGNOSIS — I110 Hypertensive heart disease with heart failure: Secondary | ICD-10-CM | POA: Diagnosis not present

## 2017-04-28 DIAGNOSIS — I428 Other cardiomyopathies: Secondary | ICD-10-CM | POA: Insufficient documentation

## 2017-04-28 LAB — URINALYSIS, ROUTINE W REFLEX MICROSCOPIC
Bilirubin Urine: NEGATIVE
GLUCOSE, UA: NEGATIVE mg/dL
Hgb urine dipstick: NEGATIVE
Ketones, ur: NEGATIVE mg/dL
LEUKOCYTES UA: NEGATIVE
Nitrite: NEGATIVE
Protein, ur: NEGATIVE mg/dL
Specific Gravity, Urine: 1.004 — ABNORMAL LOW (ref 1.005–1.030)
pH: 7 (ref 5.0–8.0)

## 2017-04-28 LAB — CBC WITH DIFFERENTIAL/PLATELET
BASOS ABS: 0 10*3/uL (ref 0.0–0.1)
BASOS PCT: 0 %
Eosinophils Absolute: 0.1 10*3/uL (ref 0.0–0.7)
Eosinophils Relative: 1 %
HEMATOCRIT: 43.3 % (ref 39.0–52.0)
Hemoglobin: 14.7 g/dL (ref 13.0–17.0)
LYMPHS PCT: 14 %
Lymphs Abs: 1.1 10*3/uL (ref 0.7–4.0)
MCH: 30.6 pg (ref 26.0–34.0)
MCHC: 33.9 g/dL (ref 30.0–36.0)
MCV: 90 fL (ref 78.0–100.0)
Monocytes Absolute: 0.6 10*3/uL (ref 0.1–1.0)
Monocytes Relative: 7 %
NEUTROS PCT: 78 %
Neutro Abs: 6.5 10*3/uL (ref 1.7–7.7)
PLATELETS: 207 10*3/uL (ref 150–400)
RBC: 4.81 MIL/uL (ref 4.22–5.81)
RDW: 12.8 % (ref 11.5–15.5)
WBC: 8.4 10*3/uL (ref 4.0–10.5)

## 2017-04-28 LAB — COMPREHENSIVE METABOLIC PANEL
ALT: 19 U/L (ref 17–63)
AST: 35 U/L (ref 15–41)
Albumin: 3.7 g/dL (ref 3.5–5.0)
Alkaline Phosphatase: 110 U/L (ref 38–126)
Anion gap: 10 (ref 5–15)
BILIRUBIN TOTAL: 0.9 mg/dL (ref 0.3–1.2)
BUN: 15 mg/dL (ref 6–20)
CHLORIDE: 102 mmol/L (ref 101–111)
CO2: 23 mmol/L (ref 22–32)
Calcium: 9 mg/dL (ref 8.9–10.3)
Creatinine, Ser: 0.73 mg/dL (ref 0.61–1.24)
GFR calc Af Amer: >60 mL/min (ref >60)
Glucose, Bld: 104 mg/dL — ABNORMAL HIGH (ref 65–99)
Potassium: 4.2 mmol/L (ref 3.5–5.1)
Sodium: 135 mmol/L (ref 135–145)
TOTAL PROTEIN: 6.5 g/dL (ref 6.5–8.1)

## 2017-04-28 LAB — I-STAT TROPONIN, ED
Troponin i, poc: 0 ng/mL (ref 0.00–0.08)
Troponin i, poc: 0 ng/mL (ref 0.00–0.08)

## 2017-04-28 LAB — PROTIME-INR
INR: 1.08
PROTHROMBIN TIME: 14 s (ref 11.4–15.2)

## 2017-04-28 LAB — MAGNESIUM: Magnesium: 2 mg/dL (ref 1.7–2.4)

## 2017-04-28 LAB — TSH: TSH: 0.024 u[IU]/mL — ABNORMAL LOW (ref 0.350–4.500)

## 2017-04-28 LAB — T4, FREE: Free T4: 1.32 ng/dL — ABNORMAL HIGH (ref 0.61–1.12)

## 2017-04-28 LAB — BRAIN NATRIURETIC PEPTIDE: B Natriuretic Peptide: 58.1 pg/mL (ref 0.0–100.0)

## 2017-04-28 MED ORDER — ASPIRIN 81 MG PO CHEW
324.0000 mg | CHEWABLE_TABLET | Freq: Once | ORAL | Status: AC
  Administered 2017-04-28: 324 mg via ORAL
  Filled 2017-04-28: qty 4

## 2017-04-28 MED ORDER — MAGNESIUM OXIDE 400 (241.3 MG) MG PO TABS
400.0000 mg | ORAL_TABLET | Freq: Every day | ORAL | Status: DC
  Administered 2017-04-29: 400 mg via ORAL
  Filled 2017-04-28: qty 1

## 2017-04-28 MED ORDER — METOPROLOL TARTRATE 25 MG PO TABS
12.5000 mg | ORAL_TABLET | Freq: Four times a day (QID) | ORAL | Status: DC
  Filled 2017-04-28: qty 1

## 2017-04-28 MED ORDER — HYDRALAZINE HCL 20 MG/ML IJ SOLN: 5.0000 mg | INTRAMUSCULAR | Status: DC | PRN

## 2017-04-28 MED ORDER — DILTIAZEM HCL 30 MG PO TABS: 30.0000 mg | ORAL_TABLET | Freq: Two times a day (BID) | ORAL | Status: DC

## 2017-04-28 MED ORDER — ALPRAZOLAM 0.5 MG PO TABS
0.5000 mg | ORAL_TABLET | Freq: Every evening | ORAL | Status: DC | PRN
  Administered 2017-04-28: 0.5 mg via ORAL
  Filled 2017-04-28: qty 1

## 2017-04-28 MED ORDER — ONDANSETRON HCL 4 MG PO TABS: 4.0000 mg | ORAL_TABLET | Freq: Four times a day (QID) | ORAL | Status: DC | PRN

## 2017-04-28 MED ORDER — LEVOTHYROXINE SODIUM 25 MCG PO TABS
25.0000 ug | ORAL_TABLET | Freq: Every day | ORAL | Status: DC
  Administered 2017-04-29: 25 ug via ORAL
  Filled 2017-04-28: qty 1

## 2017-04-28 MED ORDER — ZOLPIDEM TARTRATE 5 MG PO TABS
5.0000 mg | ORAL_TABLET | Freq: Every evening | ORAL | Status: DC | PRN
  Administered 2017-04-29: 5 mg via ORAL
  Filled 2017-04-28: qty 1

## 2017-04-28 MED ORDER — ACETAMINOPHEN 650 MG RE SUPP: 650.0000 mg | Freq: Four times a day (QID) | RECTAL | Status: DC | PRN

## 2017-04-28 MED ORDER — BENAZEPRIL HCL 20 MG PO TABS
20.0000 mg | ORAL_TABLET | Freq: Every day | ORAL | Status: DC
  Administered 2017-04-29: 20 mg via ORAL
  Filled 2017-04-28 (×2): qty 1
  Filled 2017-04-28: qty 2

## 2017-04-28 MED ORDER — ASPIRIN EC 81 MG PO TBEC: 81.0000 mg | DELAYED_RELEASE_TABLET | Freq: Every day | ORAL | Status: DC

## 2017-04-28 MED ORDER — TAMSULOSIN HCL 0.4 MG PO CAPS
0.4000 mg | ORAL_CAPSULE | Freq: Every day | ORAL | Status: DC
  Administered 2017-04-29: 0.4 mg via ORAL
  Filled 2017-04-28: qty 1

## 2017-04-28 MED ORDER — VITAMIN D 1000 UNITS PO TABS
2000.0000 [IU] | ORAL_TABLET | Freq: Every day | ORAL | Status: DC
  Administered 2017-04-29: 2000 [IU] via ORAL
  Filled 2017-04-28: qty 2

## 2017-04-28 MED ORDER — LEVOTHYROXINE SODIUM 50 MCG PO TABS
50.0000 ug | ORAL_TABLET | Freq: Every day | ORAL | Status: DC
  Filled 2017-04-28: qty 1

## 2017-04-28 MED ORDER — SODIUM CHLORIDE 0.9% FLUSH
3.0000 mL | Freq: Two times a day (BID) | INTRAVENOUS | Status: DC
  Administered 2017-04-29 (×2): 3 mL via INTRAVENOUS

## 2017-04-28 MED ORDER — ONDANSETRON HCL 4 MG/2ML IJ SOLN: 4.0000 mg | Freq: Four times a day (QID) | INTRAMUSCULAR | Status: DC | PRN

## 2017-04-28 MED ORDER — COENZYME Q10 300 MG PO CAPS: 1.0000 | ORAL_CAPSULE | Freq: Every day | ORAL | Status: DC

## 2017-04-28 MED ORDER — ENOXAPARIN SODIUM 40 MG/0.4ML ~~LOC~~ SOLN
40.0000 mg | SUBCUTANEOUS | Status: DC
  Administered 2017-04-29: 40 mg via SUBCUTANEOUS
  Filled 2017-04-28: qty 0.4

## 2017-04-28 MED ORDER — EVENING PRIMROSE OIL 1000 MG PO CAPS: 1000.0000 mg | ORAL_CAPSULE | Freq: Every day | ORAL | Status: DC

## 2017-04-28 MED ORDER — AMLODIPINE BESYLATE 5 MG PO TABS
5.0000 mg | ORAL_TABLET | Freq: Every day | ORAL | Status: DC
  Administered 2017-04-29: 5 mg via ORAL
  Filled 2017-04-28: qty 1

## 2017-04-28 MED ORDER — ACETAMINOPHEN 325 MG PO TABS: 650.0000 mg | ORAL_TABLET | Freq: Four times a day (QID) | ORAL | Status: DC | PRN

## 2017-04-28 MED ORDER — MAGNESIUM 500 MG PO TABS: 400.0000 mg | ORAL_TABLET | Freq: Every day | ORAL | Status: DC

## 2017-04-28 MED ORDER — CHOLECALCIFEROL 50 MCG (2000 UT) PO CAPS: 2000.0000 [IU] | ORAL_CAPSULE | Freq: Every day | ORAL | Status: DC

## 2017-04-28 NOTE — ED Notes (Signed)
Niu, MD at bedside. °

## 2017-04-28 NOTE — ED Notes (Signed)
tranported to radiology on monitor

## 2017-04-28 NOTE — ED Provider Notes (Signed)
[facility] DEPT Provider Note   CSN: [id number] Arrival date & time: 04/28/17  1722     History   Chief Complaint Chief Complaint  Patient presents with  . Weakness    HPI Joshua Carney is a 83 y.o. male.  The history is provided by the patient.  Weakness  Primary symptoms include visual change (Pt with blurred vision). Primary symptoms comment: Pt with generalized weaknesws. This is a new problem. The current episode started 1 to 2 hours ago. The problem has been resolved. There was no focality noted. There has been no fever. The fever has been present for less than 1 day. Pertinent negatives include no shortness of breath, no chest pain, no vomiting, no altered mental status, no confusion and no headaches. There were no medications administered prior to arrival. Associated medical issues do not include seizures, dementia or CVA.    Past Medical History:  Diagnosis Date  . BPH (benign prostatic hyperplasia)   . Degenerative disc disease, cervical   . Hearing loss   . Hypertension   . Insomnia   . Internal hemorrhoids   . Left bundle branch block   . Left cervical radiculopathy   . Mixed hyperlipidemia   . Osteoarthritis   . Peripheral neuropathy     Patient Active Problem List   Diagnosis Date Noted  . Tachycardia 04/28/2017  . LBBB (left bundle branch block) 04/28/2017  . Chronic systolic CHF (congestive heart failure) ([place]) 04/28/2017  . Blurry vision 04/28/2017  . Hypertension   . BPH (benign prostatic hyperplasia)   . Mixed hyperlipidemia   . Nonischemic cardiomyopathy ([place]) 01/27/2013  . Abnormal EKG 12/11/2012    Past Surgical History:  Procedure Laterality Date  . BACK SURGERY     lumbar  . HAND SURGERY     wrestling injury  . HEMORRHOID SURGERY    . JOINT REPLACEMENT    . LEFT HEART CATHETERIZATION WITH CORONARY ANGIOGRAM N/A 01/21/2013   Procedure: LEFT HEART CATHETERIZATION WITH CORONARY ANGIOGRAM;  Surgeon: [name], MD;  Location: [facility]  CATH LAB;  Service: Cardiovascular;  Laterality: N/A;  . TONSILLECTOMY AND ADENOIDECTOMY    . TOTAL KNEE ARTHROPLASTY     left       Home Medications    Prior to Admission medications   Medication Sig Start Date End Date Taking? Authorizing Provider  ALPRAZolam Duanne Moron) 0.5 MG tablet Take 0.5 mg by mouth at bedtime as needed. For sleep   Yes Historical Provider, MD  amLODipine-benazepril (LOTREL) 5-20 MG per capsule Take 1 capsule by mouth daily.   Yes Historical Provider, MD  aspirin EC 81 MG tablet Take 81 mg by mouth at bedtime.   Yes Historical Provider, MD  B Complex Vitamins (B COMPLEX 100 PO) Take 100 mg by mouth daily.   Yes Historical Provider, MD  Cholecalciferol (HM VITAMIN D3) 2000 UNITS CAPS Take 2,000 Units by mouth daily.    Yes Historical Provider, MD  Coenzyme Q10 (EQL COQ10) 300 MG CAPS Take one tablet by mouth once daily   Yes Historical Provider, MD  Evening Primrose Oil 1000 MG CAPS Take 1,000 mg by mouth daily.   Yes Historical Provider, MD  levothyroxine (SYNTHROID, LEVOTHROID) 50 MCG tablet Take 50 mcg by mouth daily. 04/23/17  Yes Historical Provider, MD  Magnesium 500 MG TABS Take 400 mg by mouth daily.    Yes Historical Provider, MD  Tamsulosin HCl (FLOMAX) 0.4 MG CAPS Take 0.4 mg by mouth daily.   Yes Historical  Provider, MD  atorvastatin (LIPITOR) 40 MG tablet Take 1 tablet (40 mg total) by mouth daily. Patient not taking: Reported on 04/28/2017 01/27/13   [name], MD  losartan (COZAAR) 100 MG tablet Take 0.5 tablets (50 mg total) by mouth 2 (two) times daily. Patient not taking: Reported on 04/28/2017 12/27/13   [name], MD    Family History Family History  Problem Relation Age of Onset  . Hypertension Mother     Social History Social History  Substance Use Topics  . Smoking status: Former Smoker    Types: Cigarettes  . Smokeless tobacco: Never Used     Comment: Light distant smoking history.  . Alcohol use 0.5 oz/week    1 Standard  drinks or equivalent per week     Allergies   Patient has no known allergies.   Review of Systems Review of Systems  Constitutional: Positive for fatigue. Negative for chills and fever.  HENT: Negative for ear pain and sore throat.   Eyes: Positive for visual disturbance. Negative for pain.  Respiratory: Negative for cough and shortness of breath.   Cardiovascular: Negative for chest pain and palpitations.  Gastrointestinal: Negative for abdominal pain and vomiting.  Genitourinary: Negative for dysuria and hematuria.  Musculoskeletal: Negative for arthralgias and back pain.  Skin: Negative for color change and rash.  Neurological: Positive for weakness and light-headedness. Negative for seizures, syncope and headaches.  Psychiatric/Behavioral: Negative for confusion.  All other systems reviewed and are negative.    Physical Exam Updated Vital Signs BP 120/89 (BP Location: Right Arm)   Pulse 64   Temp 98.6 F (37 C)   Resp 16   SpO2 99%   Physical Exam  Constitutional: He appears well-developed and well-nourished. He does not have a sickly appearance. He does not appear ill.  HENT:  Head: Normocephalic and atraumatic.  Eyes: Conjunctivae and EOM are normal.  Neck: Neck supple. No thyroid mass and no thyromegaly present.  Cardiovascular: An irregularly irregular rhythm present. Tachycardia present.   No murmur heard. Pulmonary/Chest: Effort normal and breath sounds normal. No respiratory distress.  Abdominal: Soft. There is no tenderness.  Musculoskeletal: He exhibits no edema.  Neurological: He is alert. GCS eye subscore is 4. GCS verbal subscore is 5. GCS motor subscore is 6.  Skin: Skin is warm and dry.  Psychiatric: He has a normal mood and affect.  Nursing note and vitals reviewed.    ED Treatments / Results  Labs (all labs ordered are listed, but only abnormal results are displayed) Labs Reviewed  COMPREHENSIVE METABOLIC PANEL - Abnormal; Notable for the  following:       Result Value   Glucose, Bld 104 (*)    All other components within normal limits  TSH - Abnormal; Notable for the following:    TSH 0.024 (*)    All other components within normal limits  T4, FREE - Abnormal; Notable for the following:    Free T4 1.32 (*)    All other components within normal limits  URINALYSIS, ROUTINE W REFLEX MICROSCOPIC - Abnormal; Notable for the following:    Specific Gravity, Urine 1.004 (*)    All other components within normal limits  CBC WITH DIFFERENTIAL/PLATELET  BRAIN NATRIURETIC PEPTIDE  PROTIME-INR  MAGNESIUM  I-STAT TROPOININ, ED  I-STAT TROPOININ, ED    EKG  EKG Interpretation  Date/Time:  Monday April 28 2017 17:53:36 EDT Ventricular Rate:  86 PR Interval:    QRS Duration: 152 QT Interval:  424  QTC Calculation: 508 R Axis:   1 Text Interpretation:  Sinus rhythm Left atrial enlargement Left bundle branch block has not changed Confirmed by Kathrynn Humble, MD, Thelma Comp [phone]) on 04/28/2017 6:24:03 PM       Radiology Dg Chest 2 View  Result Date: 04/28/2017 CLINICAL DATA:  Generalized weakness.  Visual disturbance. EXAM: CHEST  2 VIEW COMPARISON:  05/03/2015. FINDINGS: Normal sized heart. Minimal linear scarring at the left lung base without significant change. Otherwise, clear lungs. Normal vascularity. Diffuse osteopenia. Thoracic spine degenerative changes. IMPRESSION: No acute abnormality. Electronically Signed   By: Claudie Revering M.D.   On: 04/28/2017 18:32    Procedures Procedures (including critical care time)  Medications Ordered in ED Medications  aspirin chewable tablet 324 mg (not administered)  aspirin chewable tablet 324 mg (324 mg Oral Given 04/28/17 1849)     Initial Impression / Assessment and Plan / ED Course  I have reviewed the triage vital signs and the nursing notes.  Pertinent labs & imaging results that were available during my care of the patient were reviewed by me and considered in my medical  decision making (see chart for details).     CHA2DS2/VAS Stroke Risk Points      Not Applicable >= 2 Points: High Risk  1 - 1.99 Points: Medium Risk  0 Points: Low Risk    A final score could not be computed because of missing components.:   Change: N/A     This score determines the patient's risk of having a stroke if the  patient has atrial fibrillation.      This score is not applicable to this patient. Components are not  calculated.    83 year old male with history of known left bundle-branch and CHF presents in the setting of weakness and visual disturbance. Patient reports he was riding his bicycle today when he reported generalized overall weakness and some mild vision changes. For this he contact his wife. In route he reports symptoms resolved. Patient reports over the weekend he felt some generalized fatigue. No other specific complaints including no chest pain, shortness of breath, recent saccadic some recent travel.  On arrival patient hemodynamic stable and afebrile. Initial EKG revealed sinus tachycardia with frequent PACs and left bundle-branch block. No signs of acute ischemia. As patient was in the emergency department his heart rate would fluctuate greatly and repeat EKGs revealed patient likely and paroxysmal atrial fibrillation. This is new for patient. Patient additionally found to have slight thyroid abnormalities and reportedly was recently taken off his thyroid medication. No elevation in troponin and chest x-ray without signs of pneumonia or other significant abnormality. Patient remained asymptomatic while in the emergency department. No electrolyte abnormalities noted but as this is new atrial fibrillation resulting in symptoms bleed patient will require admission for further management of this condition. Likely need for anticoagulation. However it will be based on CHADSVASC2 Score. Patient stable at time of transfer of care.  Final Clinical Impressions(s) / ED  Diagnoses   Final diagnoses:  Essential hypertension  Benign prostatic hyperplasia with lower urinary tract symptoms, symptom details unspecified  Mixed hyperlipidemia  Atrial fibrillation with RVR [facility])    New Prescriptions New Prescriptions   No medications on file     Esaw Grandchild, MD 04/28/17 2028    Varney Biles, MD 04/29/17 3818

## 2017-04-28 NOTE — ED Notes (Signed)
Patient denies pain and is resting comfortably. Plan of care discussed

## 2017-04-28 NOTE — H&P (Addendum)
History and Physical    Joshua Carney YSA:[id number] DOB: [birth year] DOA: 04/28/2017  Referring MD/NP/PA:   PCP: Marylene Land, MD   Patient coming from:  The patient is coming from home.  At baseline, pt is independent for most of ADL.   Chief Complaint: generalized weakness and blurry vision  HPI: Joshua Carney is a 83 y.o. male with medical history significant of sCHF( EF 35-40% by 2d echo on 11/11/12), HTN, HLD, BPH, LBBB, C-spine degenerative disc disease, hypothyroidism, anxiety, who presents with generalized weakness and blurry vision.  Patient states that he has been having generalized weakness for 2 or 3 days, no unilateral weakness, numbness or tingliness in extremities. No slurred speech. Patient states that he had one episode of blurry vision at about 4:30, which lasted for 30 minutes, then resolved spontaneously today. Patient denies chest pain, palpitation, SOB, cough, fever, chills. No nausea, vomiting, diarrhea, abdominal pain, symptoms of UTI. No leg edema.  ED Course: pt was found to have Af b with RVR with HR up to 140-150, negative troponin, INR 1.08, magnesium 2., negative urinalysis, W basis 8.4, electrolytes renal function okay, temperature normal, oxygen saturation 98% on room air. Negative chest x-ray.  Review of Systems:   General: no fevers, chills, no changes in body weight, has fatigue HEENT: had blurry vision, no hearing changes or sore throat Respiratory: no dyspnea, coughing, wheezing CV: no chest pain, no palpitations GI: no nausea, vomiting, abdominal pain, diarrhea, constipation GU: no dysuria, burning on urination, increased urinary frequency, hematuria  Ext: no leg edema Neuro: no unilateral weakness, numbness, or tingling, no vision change or hearing loss Skin: no rash, no skin tear. MSK: No muscle spasm, no deformity, no limitation of range of movement in spin Heme: No easy bruising.  Travel history: No recent long distant travel.  Allergy:  No Known Allergies  Past Medical History:  Diagnosis Date  . BPH (benign prostatic hyperplasia)   . Degenerative disc disease, cervical   . Hearing loss   . Hypertension   . Insomnia   . Internal hemorrhoids   . Left bundle branch block   . Left cervical radiculopathy   . Mixed hyperlipidemia   . Osteoarthritis   . Peripheral neuropathy     Past Surgical History:  Procedure Laterality Date  . BACK SURGERY     lumbar  . HAND SURGERY     wrestling injury  . HEMORRHOID SURGERY    . JOINT REPLACEMENT    . LEFT HEART CATHETERIZATION WITH CORONARY ANGIOGRAM N/A 01/21/2013   Procedure: LEFT HEART CATHETERIZATION WITH CORONARY ANGIOGRAM;  Surgeon: [name], MD;  Location: [facility] CATH LAB;  Service: Cardiovascular;  Laterality: N/A;  . TONSILLECTOMY AND ADENOIDECTOMY    . TOTAL KNEE ARTHROPLASTY     left    Social History:  reports that he has quit smoking. His smoking use included Cigarettes. He has never used smokeless tobacco. He reports that he drinks about 0.5 oz of alcohol per week . He reports that he does not use drugs.  Family History:  Family History  Problem Relation Age of Onset  . Hypertension Mother      Prior to Admission medications   Medication Sig Start Date End Date Taking? Authorizing Provider  ALPRAZolam Duanne Moron) 0.5 MG tablet Take 0.5 mg by mouth at bedtime as needed. For sleep   Yes Historical Provider, MD  amLODipine-benazepril (LOTREL) 5-20 MG per capsule Take 1 capsule by mouth daily.   Yes Historical Provider, MD  aspirin EC 81 MG tablet Take 81 mg by mouth at bedtime.   Yes Historical Provider, MD  B Complex Vitamins (B COMPLEX 100 PO) Take 100 mg by mouth daily.   Yes Historical Provider, MD  Cholecalciferol (HM VITAMIN D3) 2000 UNITS CAPS Take 2,000 Units by mouth daily.    Yes Historical Provider, MD  Coenzyme Q10 (EQL COQ10) 300 MG CAPS Take one tablet by mouth once daily   Yes Historical Provider, MD  Evening Primrose Oil 1000 MG CAPS Take 1,000  mg by mouth daily.   Yes Historical Provider, MD  levothyroxine (SYNTHROID, LEVOTHROID) 50 MCG tablet Take 50 mcg by mouth daily. 04/23/17  Yes Historical Provider, MD  Magnesium 500 MG TABS Take 400 mg by mouth daily.    Yes Historical Provider, MD  Tamsulosin HCl (FLOMAX) 0.4 MG CAPS Take 0.4 mg by mouth daily.   Yes Historical Provider, MD  atorvastatin (LIPITOR) 40 MG tablet Take 1 tablet (40 mg total) by mouth daily. Patient not taking: Reported on 04/28/2017 01/27/13   [name], MD  losartan (COZAAR) 100 MG tablet Take 0.5 tablets (50 mg total) by mouth 2 (two) times daily. Patient not taking: Reported on 04/28/2017 12/27/13   [name], MD    Physical Exam: Vitals:   04/28/17 2016 04/28/17 2045 04/28/17 2128 04/28/17 2136  BP: (!) 161/90 (!) 151/63  140/83  Pulse: 69 66  67  Resp: 17 19    Temp:    98.3 F (36.8 C)  TempSrc:    Oral  SpO2: 98% 98%  100%  Weight:   80.9 kg (178 lb 4.8 oz)   Height:   5\' 8"  (1.727 m)    General: Not in acute distress HEENT:       Eyes: PERRL, EOMI, no scleral icterus.       ENT: No discharge from the ears and nose, no pharynx injection, no tonsillar enlargement.        Neck: No JVD, no bruit, no mass felt. Heme: No neck lymph node enlargement. Cardiac: S1/S2, Tachycardia, irregularly irregular rhythm, No murmurs, No gallops or rubs. Respiratory: No rales, wheezing, rhonchi or rubs. GI: Soft, nondistended, nontender, no rebound pain, no organomegaly, BS present. GU: No hematuria Ext: No pitting leg edema bilaterally. 2+DP/PT pulse bilaterally. Musculoskeletal: No joint deformities, No joint redness or warmth, no limitation of ROM in spin. Skin: No rashes.  Neuro: Alert, oriented X3, cranial nerves II-XII grossly intact, moves all extremities normally. Muscle strength 5/5 in all extremities, sensation to light touch intact. Brachial reflex 2+ bilaterally. Negative Babinski's sign. Normal finger to nose test. Psych: Patient is not  psychotic, no suicidal or hemocidal ideation.  Labs on Admission: I have personally reviewed following labs and imaging studies  CBC:  Recent Labs Lab 04/28/17 1905  WBC 8.4  NEUTROABS 6.5  HGB 14.7  HCT 43.3  MCV 90.0  PLT 161   Basic Metabolic Panel:  Recent Labs Lab 04/28/17 1905  NA 135  K 4.2  CL 102  CO2 23  GLUCOSE 104*  BUN 15  CREATININE 0.73  CALCIUM 9.0  MG 2.0   GFR: Estimated Creatinine Clearance: 71.3 mL/min (by C-G formula based on SCr of 0.73 mg/dL). Liver Function Tests:  Recent Labs Lab 04/28/17 1905  AST 35  ALT 19  ALKPHOS 110  BILITOT 0.9  PROT 6.5  ALBUMIN 3.7   No results for input(s): LIPASE, AMYLASE in the last 168 hours. No results for input(s): AMMONIA in the  last 168 hours. Coagulation Profile:  Recent Labs Lab 04/28/17 1905  INR 1.08   Cardiac Enzymes: No results for input(s): CKTOTAL, CKMB, CKMBINDEX, TROPONINI in the last 168 hours. BNP (last 3 results) No results for input(s): PROBNP in the last 8760 hours. HbA1C: No results for input(s): HGBA1C in the last 72 hours. CBG: No results for input(s): GLUCAP in the last 168 hours. Lipid Profile: No results for input(s): CHOL, HDL, LDLCALC, TRIG, CHOLHDL, LDLDIRECT in the last 72 hours. Thyroid Function Tests:  Recent Labs  04/28/17 1905  TSH 0.024*  FREET4 1.32*   Anemia Panel: No results for input(s): VITAMINB12, FOLATE, FERRITIN, TIBC, IRON, RETICCTPCT in the last 72 hours. Urine analysis:    Component Value Date/Time   COLORURINE YELLOW 04/28/2017 1844   APPEARANCEUR CLEAR 04/28/2017 1844   LABSPEC 1.004 (L) 04/28/2017 1844   PHURINE 7.0 04/28/2017 1844   GLUCOSEU NEGATIVE 04/28/2017 1844   HGBUR NEGATIVE 04/28/2017 1844   BILIRUBINUR NEGATIVE 04/28/2017 1844   KETONESUR NEGATIVE 04/28/2017 1844   PROTEINUR NEGATIVE 04/28/2017 1844   NITRITE NEGATIVE 04/28/2017 1844   LEUKOCYTESUR NEGATIVE 04/28/2017 1844   Sepsis  Labs: @LABRCNTIP (procalcitonin:4,lacticidven:4) )No results found for this or any previous visit (from the past 240 hour(s)).   Radiological Exams on Admission: Dg Chest 2 View  Result Date: 04/28/2017 CLINICAL DATA:  Generalized weakness.  Visual disturbance. EXAM: CHEST  2 VIEW COMPARISON:  05/03/2015. FINDINGS: Normal sized heart. Minimal linear scarring at the left lung base without significant change. Otherwise, clear lungs. Normal vascularity. Diffuse osteopenia. Thoracic spine degenerative changes. IMPRESSION: No acute abnormality. Electronically Signed   By: Claudie Revering M.D.   On: 04/28/2017 18:32     EKG: Independently reviewed. QTC 488, old LBBB, possible A fib with RVR.   Assessment/Plan Principal Problem:   Atrial fibrillation with RVR (H[facility]) Active Problems:   Tachycardia   LBBB (left bundle branch block)   Chronic systolic CHF (congestive heart failure) (H[facility])   Hypertension   BPH (benign prostatic hyperplasia)   Mixed hyperlipidemia   Blurry vision   Hypothyroidism   New on-set atrial fibrillation with RVR ([place]): I spoke to on-call card fellow, Dr. [place] Lions on the phone, he read the EKG and thinks that pt most likely has A fib with RVR. Dr. [place] Lions recommended to start pt with metoprolol 12.5 mg every 6 hours, can titrate up the dose as needed for heart rate control. Dr. [place] Lions also recommended to  start pt with Eliquis if MRI of brain is negative. CHA2DS2-VASc Score is 4, needs oral anticoagulation.  I discussed with the patient about starting Eliquis for Stroke prevention. Pt would like [place] to consult to his cardiologist, Dr. Percival Spanish in AM. Since his symptoms have been going on for more than 48 hours, I think it does not make big difference by holding anticoagulants tonight. Currently patient's heart rate is 70s to 80s.  - will place on Tele bed for obs - cycle CE q6 x3 and repeat EKG in the am  - aspirin 81 mg daily (pt was given 2 x 324 mg ASA in  ED) - metoprolol 12.5 mg q6h - Risk factor stratification: will check FLP and A1C, TSH, free T4 and T3 - 2d echo - please try to contact patient's cardiologist, Dr. Percival Spanish in AM.   Chronic systolic CHF (congestive heart failure) ([place]): 2-D Echo 11/11/12 showed EF 35-40 percent. Patient does not have leg edema JVD. CHF is compensated. -continue ASA -check BNP  HLD: Last LDL was  not on record. Patient was on Lipitor, which was discontinued recently due to well-controlled hyperlipidemia -Check FLP  BPH (benign prostatic hyperplasia): -continue Floimax  Hypothyroidism: Last TSH was not on record. Patient states that his Synthroid dose was recently adjusted, currently he is taking 50 g daily -Continue home Synthroid -Check TSH  Addendum: TSH is 0.024 and free T4 is elevated at 1.32 today -will decreased his Synthroid from 50-25 g daily  HTN: -Amlodipine 5 mg- Benazpril 20 mg daily  Blurry vision: Etiology is not clear. May be related to cardiac arrhythmia, atrial fibrillation, but cannot completely rule out TIA/stroke -f/u MRI of brain   DVT ppx: SQ Lovenox Code Status: Full code Family Communication:  Yes, patient's wife, daughter, son and son-in law    at bed side Disposition Plan:  Anticipate discharge back to previous home environment Consults called:  I spoke with on call Card fellow, Paulina  Admission status: Obs / tele     Date of Service 04/28/2017    Ivor Costa [facility] Pager [phone]  If 7PM-7AM, please contact night-coverage [email or web address] Password [email or web address] 04/28/2017, 9:40 PM

## 2017-04-28 NOTE — ED Triage Notes (Signed)
To ED via POV for eval of visual disturbance and general weakness while [place] his bike in the park. Pt called his wife to come pick him up and was brought to the ED. On arrival to ED pt denies any neuro deficits currently. Denies pain. Speech is clear.

## 2017-04-29 ENCOUNTER — Observation Stay (HOSPITAL_BASED_OUTPATIENT_CLINIC_OR_DEPARTMENT_OTHER): Payer: Medicare Other

## 2017-04-29 ENCOUNTER — Encounter (HOSPITAL_COMMUNITY): Payer: Self-pay | Admitting: Emergency Medicine

## 2017-04-29 DIAGNOSIS — E782 Mixed hyperlipidemia: Secondary | ICD-10-CM | POA: Diagnosis not present

## 2017-04-29 DIAGNOSIS — I4892 Unspecified atrial flutter: Secondary | ICD-10-CM

## 2017-04-29 DIAGNOSIS — E039 Hypothyroidism, unspecified: Secondary | ICD-10-CM | POA: Diagnosis not present

## 2017-04-29 DIAGNOSIS — I4891 Unspecified atrial fibrillation: Secondary | ICD-10-CM

## 2017-04-29 DIAGNOSIS — I5022 Chronic systolic (congestive) heart failure: Secondary | ICD-10-CM

## 2017-04-29 DIAGNOSIS — H538 Other visual disturbances: Secondary | ICD-10-CM | POA: Diagnosis not present

## 2017-04-29 DIAGNOSIS — I10 Essential (primary) hypertension: Secondary | ICD-10-CM | POA: Diagnosis not present

## 2017-04-29 DIAGNOSIS — N400 Enlarged prostate without lower urinary tract symptoms: Secondary | ICD-10-CM | POA: Diagnosis not present

## 2017-04-29 DIAGNOSIS — I447 Left bundle-branch block, unspecified: Secondary | ICD-10-CM | POA: Diagnosis not present

## 2017-04-29 LAB — CBC
HEMATOCRIT: 39.8 % (ref 39.0–52.0)
Hemoglobin: 13.4 g/dL (ref 13.0–17.0)
MCH: 30.4 pg (ref 26.0–34.0)
MCHC: 33.7 g/dL (ref 30.0–36.0)
MCV: 90.2 fL (ref 78.0–100.0)
PLATELETS: 188 10*3/uL (ref 150–400)
RBC: 4.41 MIL/uL (ref 4.22–5.81)
RDW: 12.6 % (ref 11.5–15.5)
WBC: 6.3 10*3/uL (ref 4.0–10.5)

## 2017-04-29 LAB — ECHOCARDIOGRAM COMPLETE
Height: 68 in
Weight: 2796.8 oz

## 2017-04-29 LAB — BASIC METABOLIC PANEL
Anion gap: 4 — ABNORMAL LOW (ref 5–15)
BUN: 13 mg/dL (ref 6–20)
CO2: 30 mmol/L (ref 22–32)
Calcium: 8.9 mg/dL (ref 8.9–10.3)
Chloride: 105 mmol/L (ref 101–111)
Creatinine, Ser: 0.61 mg/dL (ref 0.61–1.24)
GFR calc Af Amer: >60 mL/min (ref >60)
Glucose, Bld: 90 mg/dL (ref 65–99)
POTASSIUM: 4.7 mmol/L (ref 3.5–5.1)
SODIUM: 139 mmol/L (ref 135–145)

## 2017-04-29 LAB — LIPID PANEL
CHOLESTEROL: 120 mg/dL (ref 0–200)
HDL: 38 mg/dL — AB (ref >40)
LDL Cholesterol: 74 mg/dL (ref 0–99)
TRIGLYCERIDES: 42 mg/dL (ref <150)
Total CHOL/HDL Ratio: 3.2 RATIO
VLDL: 8 mg/dL (ref 0–40)

## 2017-04-29 MED ORDER — METOPROLOL SUCCINATE ER 50 MG PO TB24: 50.0000 mg | ORAL_TABLET | Freq: Every day | ORAL | 0 refills | Status: DC

## 2017-04-29 MED ORDER — METOPROLOL TARTRATE 25 MG PO TABS: 12.5000 mg | ORAL_TABLET | Freq: Four times a day (QID) | ORAL | Status: DC

## 2017-04-29 MED ORDER — METOPROLOL TARTRATE 12.5 MG HALF TABLET: 12.5000 mg | ORAL_TABLET | Freq: Two times a day (BID) | ORAL | Status: DC | PRN

## 2017-04-29 MED ORDER — APIXABAN 5 MG PO TABS: 5.0000 mg | ORAL_TABLET | Freq: Two times a day (BID) | ORAL | 0 refills | Status: DC

## 2017-04-29 MED ORDER — LEVOTHYROXINE SODIUM 25 MCG PO TABS: 25.0000 ug | ORAL_TABLET | Freq: Every day | ORAL | 0 refills | Status: DC

## 2017-04-29 MED ORDER — APIXABAN 5 MG PO TABS: 5.0000 mg | ORAL_TABLET | Freq: Two times a day (BID) | ORAL | Status: DC

## 2017-04-29 MED ORDER — METOPROLOL SUCCINATE ER 50 MG PO TB24: 50.0000 mg | ORAL_TABLET | Freq: Every day | ORAL | Status: DC

## 2017-04-29 NOTE — Care Management Obs Status (Signed)
[facility] NOTIFICATION   Patient Details  Name: Joshua Carney MRN: [id number] Date of Birth: 17-Dec-1934   Medicare Observation Status Notification Given:       Erenest Rasher, RN 04/29/2017, 11:02 AM

## 2017-04-29 NOTE — Progress Notes (Signed)
  Echocardiogram 2D Echocardiogram has been performed.  Joshua Carney 04/29/2017, 4:10 PM

## 2017-04-29 NOTE — Progress Notes (Signed)
ANTICOAGULATION CONSULT NOTE - Initial Consult  Pharmacy Consult for Apixaban Indication: atrial fibrillation  No Known Allergies  Patient Measurements: Height: 5\' 8"  (172.7 cm) Weight: 174 lb 12.8 oz (79.3 kg) IBW/kg (Calculated) : 68.4  Vital Signs: Temp: 98.1 F (36.7 C) (05/01 0545) Temp Source: Oral (05/01 0545) BP: 127/57 (05/01 0545)  Labs:  Recent Labs  04/28/17 1905 04/29/17 0254  HGB 14.7 13.4  HCT 43.3 39.8  PLT 207 188  LABPROT 14.0  --   INR 1.08  --   CREATININE 0.73 0.61    Estimated Creatinine Clearance: 71.3 mL/min (by C-G formula based on SCr of 0.61 mg/dL).   Medical History: Past Medical History:  Diagnosis Date  . BPH (benign prostatic hyperplasia)   . Degenerative disc disease, cervical   . Hearing loss   . Hypertension   . Insomnia   . Internal hemorrhoids   . Left bundle branch block   . Left cervical radiculopathy   . Mixed hyperlipidemia   . Osteoarthritis   . Peripheral neuropathy     Medications:  Prescriptions Prior to Admission  Medication Sig Dispense Refill Last Dose  . ALPRAZolam (XANAX) 0.5 MG tablet Take 0.5 mg by mouth at bedtime as needed. For sleep   04/27/2017 at Unknown time  . amLODipine-benazepril (LOTREL) 5-20 MG per capsule Take 1 capsule by mouth daily.   04/27/2017 at Unknown time  . aspirin EC 81 MG tablet Take 81 mg by mouth at bedtime.   04/27/2017 at Unknown time  . B Complex Vitamins (B COMPLEX 100 PO) Take 100 mg by mouth daily.   04/28/2017 at Unknown time  . Cholecalciferol (HM VITAMIN D3) 2000 UNITS CAPS Take 2,000 Units by mouth daily.    04/27/2017 at Unknown time  . Coenzyme Q10 (EQL COQ10) 300 MG CAPS Take one tablet by mouth once daily   04/27/2017 at Unknown time  . Evening Primrose Oil 1000 MG CAPS Take 1,000 mg by mouth daily.   04/27/2017 at Unknown time  . levothyroxine (SYNTHROID, LEVOTHROID) 50 MCG tablet Take 50 mcg by mouth daily.  0 04/28/2017 at Unknown time  . Magnesium 500 MG TABS Take 400  mg by mouth daily.    04/27/2017 at Unknown time  . Tamsulosin HCl (FLOMAX) 0.4 MG CAPS Take 0.4 mg by mouth daily.   04/27/2017 at Unknown time  . atorvastatin (LIPITOR) 40 MG tablet Take 1 tablet (40 mg total) by mouth daily. (Patient not taking: Reported on 04/28/2017) 30 tablet 11 Not Taking at Unknown time  . losartan (COZAAR) 100 MG tablet Take 0.5 tablets (50 mg total) by mouth 2 (two) times daily. (Patient not taking: Reported on 04/28/2017) 30 tablet 0 Not Taking at Unknown time   Scheduled:  . amLODipine  5 mg Oral Daily  . aspirin EC  81 mg Oral QHS  . benazepril  20 mg Oral Daily  . cholecalciferol  2,000 Units Oral Daily  . enoxaparin (LOVENOX) injection  40 mg Subcutaneous Q24H  . levothyroxine  25 mcg Oral QAC breakfast  . magnesium oxide  400 mg Oral Daily  . [START ON 04/30/2017] metoprolol succinate  50 mg Oral Daily  . metoprolol tartrate  12.5 mg Oral Q6H  . sodium chloride flush  3 mL Intravenous Q12H  . tamsulosin  0.4 mg Oral Daily   Infusions:    Assessment: 83yo male with history of CHF, HLD, HTN and LBBB presents with generalized weakness and blurry vision. Patient developed AFib with RVR  while admitted. Pharmacy is consulted to dose apixaban for atrial fibrillation. Patient received lovenox for DVT prophylaxis around midnight. Will start apixaban this evening.  Goal of Therapy:  Monitor platelets by anticoagulation protocol: Yes   Plan:  Apixaban 5mg  PO BID Monitor s/sx of bleeding Educate patient on apixaban  Andrey Cota. Diona Foley, PharmD, BCPS Clinical Pharmacist [phone] 04/29/2017,12:46 PM

## 2017-04-29 NOTE — Consult Note (Signed)
CARDIOLOGY CONSULT NOTE   Patient ID: Joshua Carney MRN: [id number] DOB/AGE: 05/17/1934 83 y.o. 1 Admit date: 04/28/2017  Requesting Physician: Dr. Eliseo Squires Primary Physician:   Marylene Land, MD Primary Cardiologist:   Dr. Percival Spanish Reason for Consultation:   Anticoagulation for new diagnosis atrial fibrillation with RVR  HPI: Joshua Carney is a 83 y.o. male who is being seen today in consultation at the request of Dr. Eliseo Squires for new onset A. Fib with RVR.  The patient has as PMH of significant of sCHF( EF 35-40% by 2d echo on 11/11/12), HTN, HLD, BPH, LBBB, C-spine degenerative disc disease, hypothyroidism, anxiety,   /p Cath in 2014 cardiomyopathy with left bundle branch block. Cath demonstrated 25% LAD stenosis, 25% diagonal stenosis, 50% ramus intermediate stenosis, 95% stenosis in the AV groove leading into a possibly moderate sized marginal. The right coronary artery proximal 30% stenosis. The PDA had ostial 30% stenosis. The EF appeared to be about 55%.  He see's Dr. Percival Spanish but there are no encounters since 2014. He presented to the Emergency department because while riding his bike he developed weakness and visual disturbance. He was noted to be atrial fibrillation with RVR, likely paroxysmal and asymptomatic from it. Normal chest xray and no troponin elevation. EKG shows sinus tachycardia with frequent PVCs and left bundle branch block without signs of acute ischemia.  The admitting hospitalist spoke with the cardiology fellow Dr. Blenda Mounts on the phone and he recommended metoprolol 12.5 q 6 hr, can ttitrate up as needed for heart rate.CHA2DS2-VASc Score is 4, MRI negative to acute findings, patient needs to be anticoagulated. He would like to confer this with his cardiologist Dr. Percival Spanish and declines starting anticoags at this time.  (net loss 1.26 liters) Today's Weight: 174 lb Admission Weight:  178 lb Blood Pressure: 127/57  Hr: 61 Labs:  Creatinine 0.61,  K  4.7, Na  139 , WBC 6.3, Hbg 13.4     Past Medical History:  Diagnosis Date  . BPH (benign prostatic hyperplasia)   . Degenerative disc disease, cervical   . Hearing loss   . Hypertension   . Insomnia   . Internal hemorrhoids   . Left bundle branch block   . Left cervical radiculopathy   . Mixed hyperlipidemia   . Osteoarthritis   . Peripheral neuropathy      Past Surgical History:  Procedure Laterality Date  . BACK SURGERY     lumbar  . HAND SURGERY     wrestling injury  . HEMORRHOID SURGERY    . JOINT REPLACEMENT    . LEFT HEART CATHETERIZATION WITH CORONARY ANGIOGRAM N/A 01/21/2013   Procedure: LEFT HEART CATHETERIZATION WITH CORONARY ANGIOGRAM;  Surgeon: [name], MD;  Location: [facility] CATH LAB;  Service: Cardiovascular;  Laterality: N/A;  . TONSILLECTOMY AND ADENOIDECTOMY    . TOTAL KNEE ARTHROPLASTY     left    No Known Allergies  I have reviewed the patient's current medications . amLODipine  5 mg Oral Daily  . aspirin EC  81 mg Oral QHS  . benazepril  20 mg Oral Daily  . cholecalciferol  2,000 Units Oral Daily  . enoxaparin (LOVENOX) injection  40 mg Subcutaneous Q24H  . levothyroxine  25 mcg Oral QAC breakfast  . magnesium oxide  400 mg Oral Daily  . metoprolol tartrate  12.5 mg Oral Q6H  . sodium chloride flush  3 mL Intravenous Q12H  . tamsulosin  0.4 mg Oral Daily  acetaminophen **OR** acetaminophen, ALPRAZolam, hydrALAZINE, ondansetron **OR** ondansetron (ZOFRAN) IV, zolpidem  Prior to Admission medications   Medication Sig Start Date End Date Taking? Authorizing Provider  ALPRAZolam Duanne Moron) 0.5 MG tablet Take 0.5 mg by mouth at bedtime as needed. For sleep   Yes Historical Provider, MD  amLODipine-benazepril (LOTREL) 5-20 MG per capsule Take 1 capsule by mouth daily.   Yes Historical Provider, MD  aspirin EC 81 MG tablet Take 81 mg by mouth at bedtime.   Yes Historical Provider, MD  B Complex Vitamins (B COMPLEX 100 PO) Take 100 mg by mouth  daily.   Yes Historical Provider, MD  Cholecalciferol (HM VITAMIN D3) 2000 UNITS CAPS Take 2,000 Units by mouth daily.    Yes Historical Provider, MD  Coenzyme Q10 (EQL COQ10) 300 MG CAPS Take one tablet by mouth once daily   Yes Historical Provider, MD  Evening Primrose Oil 1000 MG CAPS Take 1,000 mg by mouth daily.   Yes Historical Provider, MD  levothyroxine (SYNTHROID, LEVOTHROID) 50 MCG tablet Take 50 mcg by mouth daily. 04/23/17  Yes Historical Provider, MD  Magnesium 500 MG TABS Take 400 mg by mouth daily.    Yes Historical Provider, MD  Tamsulosin HCl (FLOMAX) 0.4 MG CAPS Take 0.4 mg by mouth daily.   Yes Historical Provider, MD  atorvastatin (LIPITOR) 40 MG tablet Take 1 tablet (40 mg total) by mouth daily. Patient not taking: Reported on 04/28/2017 01/27/13   [name], MD  losartan (COZAAR) 100 MG tablet Take 0.5 tablets (50 mg total) by mouth 2 (two) times daily. Patient not taking: Reported on 04/28/2017 12/27/13   [name], MD     Social History   Social History  . Marital status: Married    Spouse name: N/A  . Number of children: 2  . Years of education: N/A   Occupational History  . Not on file.   Social History Main Topics  . Smoking status: Former Smoker    Types: Cigarettes  . Smokeless tobacco: Never Used     Comment: Light distant smoking history.  . Alcohol use 0.5 oz/week    1 Standard drinks or equivalent per week  . Drug use: No  . Sexual activity: Not on file   Other Topics Concern  . Not on file   Social History Narrative   Three grandchildren.  Lives at home with wife.     Family Status  Relation Status  . Mother Deceased  . Father Deceased  . Sister Alive   Family History  Problem Relation Age of Onset  . Hypertension Mother   . Hypertension Father   . Gallbladder disease Father   . Atrial fibrillation Sister   . High blood pressure Sister         ROS:  Full 14 point review of systems complete and found to be negative  unless listed above.  Physical Exam: Blood pressure (!) 127/57, pulse (!) 59, temperature 98.1 F (36.7 C), temperature source Oral, resp. rate 19, height 5\' 8"  (1.727 m), weight 174 lb 12.8 oz (79.3 kg), SpO2 98 %.  General: Well developed, well nourished, male in no acute distress Head: No xanthomas. Normocephalic and atraumatic, Lungs: normal effort and rate of breathing. Heart:: intermittent tachycardia,  irregularily irregular,  no JVD Neck: No carotid bruits. No lymphadenopathy. Abdomen: Bowel sounds present, abdomen soft and non-tender Msk:  Spontaneously moves all 4 extremities Extremities: No clubbing or cyanosis.  No le edema  Neuro: Alert and oriented X  3. No focal deficits noted. Psych:  Good affect, responds appropriately Skin: No rashes or lesions noted.     Labs:   Lab Results  Component Value Date   WBC 6.3 04/29/2017   HGB 13.4 04/29/2017   HCT 39.8 04/29/2017   MCV 90.2 04/29/2017   PLT 188 04/29/2017    Recent Labs  04/28/17 1905  INR 1.08     Recent Labs Lab 04/28/17 1905 04/29/17 0254  NA 135 139  K 4.2 4.7  CL 102 105  CO2 23 30  BUN 15 13  CREATININE 0.73 0.61  CALCIUM 9.0 8.9  PROT 6.5  --   BILITOT 0.9  --   ALKPHOS 110  --   ALT 19  --   AST 35  --   GLUCOSE 104* 90  ALBUMIN 3.7  --    Magnesium  Date Value Ref Range Status  04/28/2017 2.0 1.7 - 2.4 mg/dL Final   No results for input(s): CKTOTAL, CKMB, TROPONINI in the last 72 hours.  Recent Labs  04/28/17 1816 04/28/17 2044  TROPIPOC 0.00 0.00   No results found for: PROBNP Lab Results  Component Value Date   CHOL 120 04/29/2017   HDL 38 (L) 04/29/2017   LDLCALC 74 04/29/2017   TRIG 42 04/29/2017   No results found for: DDIMER No results found for: LIPASE, AMYLASE TSH  Date/Time Value Ref Range Status  04/28/2017 07:05 PM 0.024 (L) 0.350 - 4.500 uIU/mL Final    Comment:    Performed by a 3rd Generation assay with a functional sensitivity of <=0.01 uIU/mL.       Echo   Echo pending for this admission.  ECG:     HR 56, sinus brady with LBBB (left bundle is old)- independently reviewed.   Radiology:Dg Chest 2 View  Result Date: 04/28/2017 CLINICAL DATA:  Generalized weakness.  Visual disturbance. EXAM: CHEST  2 VIEW COMPARISON:  05/03/2015. FINDINGS: Normal sized heart. Minimal linear scarring at the left lung base without significant change. Otherwise, clear lungs. Normal vascularity. Diffuse osteopenia. Thoracic spine degenerative changes. IMPRESSION: No acute abnormality. Electronically Signed   By: Claudie Revering M.D.   On: 04/28/2017 18:32   Mr Brain Wo Contrast  Result Date: 04/28/2017 CLINICAL DATA:  Initial evaluation for acute blurry vision. EXAM: MRI HEAD WITHOUT CONTRAST TECHNIQUE: Multiplanar, multiecho pulse sequences of the brain and surrounding structures were obtained without intravenous contrast. COMPARISON:  The the prior MRI from 12/19/2014. FINDINGS: Brain: Mild age-related cerebral atrophy with chronic microvascular ischemic disease. No abnormal foci of restricted diffusion to suggest acute or subacute ischemia. Gray-white matter differentiation maintained. No areas of chronic infarction. Few scattered subcentimeter foci of susceptibility artifact seen involving the supratentorial brain, most consistent with small chronic micro hemorrhages, most likely related chronic underlying hypertension. No evidence for acute intracranial hemorrhage. No mass lesion, midline shift, or mass effect. Mild ventricular prominence related to global parenchymal volume loss without hydrocephalus. No extra-axial fluid collection. Major dural sinuses are grossly patent. Pituitary gland suprasellar region within normal limits. Midline structures intact and normal. Vascular: Major intracranial vascular flow voids are well maintained. Skull and upper cervical spine: Craniocervical junction within normal limits. Visualized upper cervical spine unremarkable.  Bone marrow signal intensity within normal limits. No scalp soft tissue abnormality. Sinuses/Orbits: Globes and orbital soft tissues within normal limits. Patient status post lens extraction on the left. Paranasal sinuses are clear. No mastoid effusion. Inner ear structures normal. Other: No other significant finding. IMPRESSION: 1. No  acute intracranial process identified. 2. Mild atrophy with chronic small vessel ischemic disease, stable. 3. Few small chronic micro hemorrhages, most likely related to chronic underlying hypertension. Electronically Signed   By: Jeannine Boga M.D.   On: 04/28/2017 23:03        ASSESSMENT AND PLAN:      Principal Problem:   Atrial fibrillation with RVR (HCC) Active Problems:   Tachycardia   LBBB (left bundle branch block)   Chronic systolic CHF (congestive heart failure) (HCC)   Hypertension   BPH (benign prostatic hyperplasia)   Mixed hyperlipidemia   Blurry vision   Hypothyroidism  (net loss 1.26 liters) Today's Weight: 174 lb Admission Weight:  178 lb Blood Pressure: 127/57  Hr: 61 Labs:  Creatinine 0.61,  K 4.7, Na  139 , WBC 6.3, Hbg 13.4  New on-set atrial fibrillation with RVR-- CHA2DS2-VASc Score is 6: on Metoprolol 12.5 mg q 6 can titrate up as needed for heart rate control. This patient needs anticoagulation and this will be our recommendation, as well as Dr. Cherlyn Cushing recommendation. -- 2D echo pending for this admission  Chronic systolic CHF (congestive heart failure) ([place]): 2-D Echo 11/11/12 showed EF 35-40 percent. Repeating during this admission. Patient does not have leg edema JVD. --Continue ASA   --BNP 58.1   HLD: Patient was on Lipitor, which was discontinued recently due to well-controlled hyperlipidemia -- LDL 74  BPH (benign prostatic hyperplasia): medicine to manage  Hypothyroidism:  Medicine to manage -- TSH is 0.024 and free T4 is elevated at 1.32 today will decreased his Synthroid from 50-25 g daily  HTN:  BP well controlled on his dose  -- Amlodipine 5 mg, consider discontinuing this medication in setting of systolic heart failure. -  Benazpril 20 mg daily  Blurry vision: Etiology is not clear. Medicine to manage   Signed: Linus Mako, PA-C 04/29/2017 12:17 PM

## 2017-04-29 NOTE — Discharge Summary (Signed)
Physician Discharge Summary  Joshua Carney AUQ:[id number] DOB: October 08, 1934 DOA: 04/28/2017  PCP: Marylene Land, MD  Admit date: 04/28/2017 Discharge date: 04/29/2017   Recommendations for Outpatient Follow-Up:   1. Monitor TSH- synthroid adjusted   Discharge Diagnosis:   Principal Problem:   Atrial fibrillation with RVR (HCC) Active Problems:   Tachycardia   LBBB (left bundle branch block)   Chronic systolic CHF (congestive heart failure) (HCC)   Hypertension   BPH (benign prostatic hyperplasia)   Mixed hyperlipidemia   Blurry vision   Hypothyroidism   Discharge disposition:  Home.  Discharge Condition: Improved.  Diet recommendation: Low sodium, heart healthy. .  Wound care: None.   History of Present Illness:   Joshua Carney is a 83 y.o. male with medical history significant of sCHF( EF 35-40% by 2d echo on 11/11/12), HTN, HLD, BPH, LBBB, C-spine degenerative disc disease, hypothyroidism, anxiety, who presents with generalized weakness and blurry vision.  Patient states that he has been having generalized weakness for 2 or 3 days, no unilateral weakness, numbness or tingliness in extremities. No slurred speech. Patient states that he had one episode of blurry vision at about 4:30, which lasted for 30 minutes, then resolved spontaneously today. Patient denies chest pain, palpitation, SOB, cough, fever, chills. No nausea, vomiting, diarrhea, abdominal pain, symptoms of UTI. No leg edema.   Hospital Course by Problem:   New a fib -metoprolol 50 daily -eliquis -synthroid adjusted as TSH low -echo: Normal LV systolic function; mild diastolic dysfunction; trace   MR; mild LAE  Hypothyroid Synthroid adjusted upon admission-- will need to be followed closely    Medical Consultants:    cards   Discharge Exam:   Vitals:   04/29/17 0545 04/29/17 1354  BP: (!) 127/57 (!) 127/51  Pulse:  (!) 59  Resp:  18  Temp: 98.1 F (36.7 C) 98 F (36.7 C)    Vitals:   04/28/17 2136 04/29/17 0018 04/29/17 0545 04/29/17 1354  BP: 140/83 (!) 156/73 (!) 127/57 (!) 127/51  Pulse: 67 (!) 59  (!) 59  Resp:    18  Temp: 98.3 F (36.8 C)  98.1 F (36.7 C) 98 F (36.7 C)  TempSrc: Oral  Oral Oral  SpO2: 100% 99% 98% 98%  Weight:   79.3 kg (174 lb 12.8 oz)   Height:   5\' 8"  (1.727 m)     Gen:  NAD- very active and anxious to go home  The results of significant diagnostics from this hospitalization (including imaging, microbiology, ancillary and laboratory) are listed below for reference.     Procedures and Diagnostic Studies:   Dg Chest 2 View  Result Date: 04/28/2017 CLINICAL DATA:  Generalized weakness.  Visual disturbance. EXAM: CHEST  2 VIEW COMPARISON:  05/03/2015. FINDINGS: Normal sized heart. Minimal linear scarring at the left lung base without significant change. Otherwise, clear lungs. Normal vascularity. Diffuse osteopenia. Thoracic spine degenerative changes. IMPRESSION: No acute abnormality. Electronically Signed   By: Claudie Revering M.D.   On: 04/28/2017 18:32   Mr Brain Wo Contrast  Result Date: 04/28/2017 CLINICAL DATA:  Initial evaluation for acute blurry vision. EXAM: MRI HEAD WITHOUT CONTRAST TECHNIQUE: Multiplanar, multiecho pulse sequences of the brain and surrounding structures were obtained without intravenous contrast. COMPARISON:  The the prior MRI from 12/19/2014. FINDINGS: Brain: Mild age-related cerebral atrophy with chronic microvascular ischemic disease. No abnormal foci of restricted diffusion to suggest acute or subacute ischemia. Gray-white matter differentiation maintained. No areas of chronic infarction.  Few scattered subcentimeter foci of susceptibility artifact seen involving the supratentorial brain, most consistent with small chronic micro hemorrhages, most likely related chronic underlying hypertension. No evidence for acute intracranial hemorrhage. No mass lesion, midline shift, or mass effect. Mild ventricular  prominence related to global parenchymal volume loss without hydrocephalus. No extra-axial fluid collection. Major dural sinuses are grossly patent. Pituitary gland suprasellar region within normal limits. Midline structures intact and normal. Vascular: Major intracranial vascular flow voids are well maintained. Skull and upper cervical spine: Craniocervical junction within normal limits. Visualized upper cervical spine unremarkable. Bone marrow signal intensity within normal limits. No scalp soft tissue abnormality. Sinuses/Orbits: Globes and orbital soft tissues within normal limits. Patient status post lens extraction on the left. Paranasal sinuses are clear. No mastoid effusion. Inner ear structures normal. Other: No other significant finding. IMPRESSION: 1. No acute intracranial process identified. 2. Mild atrophy with chronic small vessel ischemic disease, stable. 3. Few small chronic micro hemorrhages, most likely related to chronic underlying hypertension. Electronically Signed   By: Jeannine Boga M.D.   On: 04/28/2017 23:03     Labs:   Basic Metabolic Panel:  Recent Labs Lab 04/28/17 1905 04/29/17 0254  NA 135 139  K 4.2 4.7  CL 102 105  CO2 23 30  GLUCOSE 104* 90  BUN 15 13  CREATININE 0.73 0.61  CALCIUM 9.0 8.9  MG 2.0  --    GFR Estimated Creatinine Clearance: 71.3 mL/min (by C-G formula based on SCr of 0.61 mg/dL). Liver Function Tests:  Recent Labs Lab 04/28/17 1905  AST 35  ALT 19  ALKPHOS 110  BILITOT 0.9  PROT 6.5  ALBUMIN 3.7   No results for input(s): LIPASE, AMYLASE in the last 168 hours. No results for input(s): AMMONIA in the last 168 hours. Coagulation profile  Recent Labs Lab 04/28/17 1905  INR 1.08    CBC:  Recent Labs Lab 04/28/17 1905 04/29/17 0254  WBC 8.4 6.3  NEUTROABS 6.5  --   HGB 14.7 13.4  HCT 43.3 39.8  MCV 90.0 90.2  PLT 207 188   Cardiac Enzymes: No results for input(s): CKTOTAL, CKMB, CKMBINDEX, TROPONINI in the  last 168 hours. BNP: Invalid input(s): POCBNP CBG: No results for input(s): GLUCAP in the last 168 hours. D-Dimer No results for input(s): DDIMER in the last 72 hours. Hgb A1c No results for input(s): HGBA1C in the last 72 hours. Lipid Profile  Recent Labs  04/29/17 0254  CHOL 120  HDL 38*  LDLCALC 74  TRIG 42  CHOLHDL 3.2   Thyroid function studies  Recent Labs  04/28/17 1905  TSH 0.024*   Anemia work up No results for input(s): VITAMINB12, FOLATE, FERRITIN, TIBC, IRON, RETICCTPCT in the last 72 hours. Microbiology No results found for this or any previous visit (from the past 240 hour(s)).   Discharge Instructions:   Discharge Instructions    Diet - low sodium heart healthy    Complete by:  As directed    Discharge instructions    Complete by:  As directed    Outpatient TSH, free t4 in 6 weeks-- synthroid adjusted   Increase activity slowly    Complete by:  As directed      Allergies as of 04/29/2017   No Known Allergies     Medication List    STOP taking these medications   atorvastatin 40 MG tablet Commonly known as:  LIPITOR   losartan 100 MG tablet Commonly known as:  COZAAR  TAKE these medications   ALPRAZolam 0.5 MG tablet Commonly known as:  XANAX Take 0.5 mg by mouth at bedtime as needed. For sleep   amLODipine-benazepril 5-20 MG capsule Commonly known as:  LOTREL Take 1 capsule by mouth daily.   apixaban 5 MG Tabs tablet Commonly known as:  ELIQUIS Take 1 tablet (5 mg total) by mouth 2 (two) times daily.   aspirin EC 81 MG tablet Take 81 mg by mouth at bedtime.   B COMPLEX 100 PO Take 100 mg by mouth daily.   EQL COQ10 300 MG Caps Generic drug:  Coenzyme Q10 Take one tablet by mouth once daily   Evening Primrose Oil 1000 MG Caps Take 1,000 mg by mouth daily.   HM VITAMIN D3 2000 units Caps Generic drug:  Cholecalciferol Take 2,000 Units by mouth daily.   levothyroxine 25 MCG tablet Commonly known as:  SYNTHROID,  LEVOTHROID Take 1 tablet (25 mcg total) by mouth daily before breakfast. Start taking on:  04/30/2017 What changed:  medication strength  how much to take  when to take this   Magnesium 500 MG Tabs Take 400 mg by mouth daily.   metoprolol succinate 50 MG 24 hr tablet Commonly known as:  TOPROL XL Take 1 tablet (50 mg total) by mouth daily. Take with or immediately following a meal. Start taking on:  04/30/2017   tamsulosin 0.4 MG Caps capsule Commonly known as:  FLOMAX Take 0.4 mg by mouth daily.         Time coordinating discharge: 25 min  Signed:  Danuel Felicetti U Ilse Billman   [facility] 04/29/2017, 3:06 PM

## 2017-04-29 NOTE — Plan of Care (Signed)
Problem: Education: Goal: Knowledge of [facility] Education information/materials will improve Outcome: Progressing Patient educated on unit routine, EKG monitoring, and Lovenox.   Problem: Safety: Goal: Ability to remain free from injury will improve Outcome: Progressing Patient remains free from injury. Uses call light appropriately. Steady gait.   Problem: Pain Managment: Goal: General experience of comfort will improve Outcome: Progressing Patient has had no complaints of pain.

## 2017-04-29 NOTE — Care Management Note (Signed)
Case Management Note  Patient Details  Name: Joshua Carney MRN: [id number] Date of Birth: 1934/10/27  Subjective/Objective:   New onset Afib with RVR               Action/Plan: Discharge Planning: NCMs poke to pt and wife at bedside. Pt states he is very active and independent at home. Has cane that he uses as needed. Waiting on final recommendations for home. Possible Eliquis vs coumadin. Pt will receive Eliquis 30 day free trial card and will send for benefits check.   PCP Derinda Late MD   Expected Discharge Date:                  Expected Discharge Plan:  Home/Self Care  In-House Referral:  NA  Discharge planning Services  CM Consult  Post Acute Care Choice:  NA Choice offered to:  NA  DME Arranged:  N/A DME Agency:  NA  HH Arranged:  NA HH Agency:  NA  Status of Service:  Completed, signed off  If discussed at [place] of Stay Meetings, dates discussed:    Additional Comments:  Erenest Rasher, RN 04/29/2017, 10:57 AM

## 2017-04-29 NOTE — Care Management Obs Status (Signed)
[place] NOTIFICATION   Patient Details  Name: ABDIMALIK MAYORQUIN MRN: [id number] Date of Birth: [birth year]   Medicare Observation Status Notification Given:  Yes    Erenest Rasher, RN 04/29/2017, 3:31 PM

## 2017-04-29 NOTE — Care Management (Addendum)
Case Management Note Initial Note started BY Jonnie Finner, RN Case Manager on 04-29-17 @ 1101 am.  Patient Details  Name: Joshua Carney MRN: [id number] Date of Birth: 10-03-1934  Subjective/Objective:   New onset Afib with RVR               Action/Plan: Discharge Planning: NCMs poke to pt and wife at bedside. Pt states he is very active and independent at home. Has cane that he uses as needed. Waiting on final recommendations for home. Possible Eliquis vs coumadin. Pt will receive Eliquis 30 day free trial card and will send for benefits check.   PCP Derinda Late MD   Expected Discharge Date:                         Expected Discharge Plan:  Home/Self Care  In-House Referral:  NA  Discharge planning Services  CM Consult  Post Acute Care Choice:  NA Choice offered to:  NA  DME Arranged:  N/A DME Agency:  NA  HH Arranged:  NA HH Agency:  NA  Status of Service:  Completed, signed off  If discussed at [place] of Stay Meetings, dates discussed:    Additional Comments: 04-29-17 [address] Jacqlyn Krauss, RN,BSN [phone] CM will provide pt with the 30 day free Eliquis card. [facility] on [address] and [address] has medication available. No further needs from CM at this time.  S/W [facility]  @ [place] # [phone]   1. COUMADIN 2 TO 5 MG ONCE DAILY  COVER- YES  CO-PAY- $ 40.00  TIER- 2 DRUG  PRIOR APPROVAL- NO   2. XARELTO  20 MG DAILY  COVER- YES  CO-PAY- $ 40.00  TIER- 2 DRUG  PRIOR APPROVAL- NO   3. ELIQUIS 2.5 MG BID  COVER- YES  CO-PAY- $ 40.00  TIER- 2 DRUG  PRIOR APPROVAL- NO   4. ELIQUIS 5 MG BID  COVER- YES  CO-PAY- $ 40.00  TIER- 2 DRUG  PRIOR APPROVAL- NO   PHARMACY : ANY RETAIL/[facility] AND [facility]   04-29-17 [address] Jacqlyn Krauss, RN,BSN [phone] Benefits check in process for Eliquis, Xarelto and Coumadin. CM will make pt and MD aware of cost once completed.

## 2017-04-29 NOTE — Progress Notes (Signed)
New a fib-- appears to be back in sinus-- very active riding bikes-- metoprolol started and patient request cardiology (Hochrein) be consulted for blood thinner (had episode of blurry vision but MRI negative-- suspect TIA) TSH and ft4 abnormal- synthroid decreased Echo ordered and will hope for d/c this PM Eulogio Bear DO

## 2017-04-30 LAB — T3, FREE: T3, Free: 4.2 pg/mL (ref 2.0–4.4)

## 2017-04-30 LAB — HEMOGLOBIN A1C
HEMOGLOBIN A1C: 4.9 % (ref 4.8–5.6)
MEAN PLASMA GLUCOSE: 94 mg/dL

## 2017-05-29 NOTE — Progress Notes (Signed)
Cardiology Office Note   Date:  05/30/2017   ID:  Joshua Carney, DOB 08-Apr-1934, MRN [id number]  PCP:  Derinda Late, MD  Cardiologist:   [name], MD     Chief Complaint  Patient presents with  . Atrial Fibrillation      History of Present Illness: Joshua Carney is a 83 y.o. male who presents for evaluation of atrial fib.  I saw him last in 2014.   He had a cardiomyopathy with left bundle branch block. Cath demonstrated 25% LAD stenosis, 25% diagonal stenosis, 50% ramus intermediate stenosis, 95% stenosis in the AV groove leading into a possibly moderate sized marginal. The right coronary artery proximal 30% stenosis. The PDA had ostial 30% stenosis. The EF appeared to be about 55% on cath.  He was hospitalized last month with atrial fib.  I reviewed these records for this visit.   He was treated with rate control and started on Eliquis.  He had an outpatient echo with normal EF.       It turns out that when he had his atrial fibrillation his TSH was very low. He had his dose of Synthroid reduced.  By exam he's in sinus today.He hasn't felt atrial fibrillation. He feels very well. The patient denies any new symptoms such as chest discomfort, neck or arm discomfort. There has been no new shortness of breath, PND or orthopnea. There have been no reported palpitations, presyncope or syncope.   He is walking 2 Macht daily and he bikes routinely.   The patient denies any new symptoms such as chest discomfort, neck or arm discomfort. There has been no new shortness of breath, PND or orthopnea. There have been no reported palpitations, presyncope or syncope.   He actually stopped taking Eliquis   Past Medical History:  Diagnosis Date  . BPH (benign prostatic hyperplasia)   . Degenerative disc disease, cervical   . Hearing loss   . Hypertension   . Insomnia   . Internal hemorrhoids   . Left bundle branch block   . Left cervical radiculopathy   . Mixed hyperlipidemia   .  Osteoarthritis   . Peripheral neuropathy     Past Surgical History:  Procedure Laterality Date  . BACK SURGERY     lumbar  . HAND SURGERY     wrestling injury  . HEMORRHOID SURGERY    . JOINT REPLACEMENT    . LEFT HEART CATHETERIZATION WITH CORONARY ANGIOGRAM N/A 01/21/2013   Procedure: LEFT HEART CATHETERIZATION WITH CORONARY ANGIOGRAM;  Surgeon: [name], MD;  Location: [facility] CATH LAB;  Service: Cardiovascular;  Laterality: N/A;  . TONSILLECTOMY AND ADENOIDECTOMY    . TOTAL KNEE ARTHROPLASTY     left     Current Outpatient Prescriptions  Medication Sig Dispense Refill  . ALPRAZolam (XANAX) 0.5 MG tablet Take 0.5 mg by mouth at bedtime as needed. For sleep    . amLODipine-benazepril (LOTREL) 5-20 MG per capsule Take 1 capsule by mouth daily.    Marland Kitchen aspirin EC 81 MG tablet Take 81 mg by mouth at bedtime.    . B Complex Vitamins (B COMPLEX 100 PO) Take 100 mg by mouth daily.    . Cholecalciferol (HM VITAMIN D3) 2000 UNITS CAPS Take 5,000 Units by mouth daily.     . Coenzyme Q10 (EQL COQ10) 300 MG CAPS Take one tablet by mouth once daily    . Evening Primrose Oil 1000 MG CAPS Take 1,000 mg by mouth daily.    Marland Kitchen  hydroxypropyl methylcellulose / hypromellose (ISOPTO TEARS / GONIOVISC) 2.5 % ophthalmic solution Place 1 drop into both eyes as needed for dry eyes.    Marland Kitchen levothyroxine (SYNTHROID, LEVOTHROID) 25 MCG tablet Take 1 tablet (25 mcg total) by mouth daily before breakfast. 30 tablet 0  . Magnesium 500 MG TABS Take 400 mg by mouth daily.     . Melatonin 10 MG TABS Take 10 mg by mouth daily.    . metoprolol succinate (TOPROL XL) 50 MG 24 hr tablet Take 1 tablet (50 mg total) by mouth daily. Take with or immediately following a meal. 30 tablet 0  . Tamsulosin HCl (FLOMAX) 0.4 MG CAPS Take 0.4 mg by mouth daily.     No current facility-administered medications for this visit.     Allergies:   Patient has no known allergies.    ROS:  Please see the history of present illness.    Otherwise, review of systems are positive for none.   All other systems are reviewed and negative.    PHYSICAL EXAM: VS:  BP (!) 138/54   Pulse 78   Ht 5' 9.5" (1.765 m)   Wt 175 lb (79.4 kg)   BMI 25.47 kg/m  , BMI Body mass index is 25.47 kg/m. GENERAL:  Well appearing and looks much younger than stated age.  HEENT:  Pupils equal round and reactive, fundi not visualized, oral mucosa unremarkable NECK:  No jugular venous distention, waveform within normal limits, carotid upstroke brisk and symmetric, no bruits, no thyromegaly LYMPHATICS:  No cervical, inguinal adenopathy LUNGS:  Clear to auscultation bilaterally BACK:  No CVA tenderness CHEST:  Unremarkable HEART:  PMI not displaced or sustained,S1 and S2 within normal limits, no S3, no S4, no clicks, no rubs, no murmurs ABD:  Flat, positive bowel sounds normal in frequency in pitch, no bruits, no rebound, no guarding, no midline pulsatile mass, no hepatomegaly, no splenomegaly EXT:  2 plus pulses throughout, no edema, no cyanosis no clubbing SKIN:  No rashes no nodules NEURO:  Cranial nerves II through XII grossly intact, motor grossly intact throughout PSYCH:  Cognitively intact, oriented to person place and time    EKG:  EKG is not ordered today. The ekg ordered 04/28/17 demonstrates atrial fib with LBBB, rate 132   Recent Labs: 04/28/2017: ALT 19; B Natriuretic Peptide 58.1; Magnesium 2.0; TSH 0.024 04/29/2017: BUN 13; Creatinine, Ser 0.61; Hemoglobin 13.4; Platelets 188; Potassium 4.7; Sodium 139    Lipid Panel    Component Value Date/Time   CHOL 120 04/29/2017 0254   TRIG 42 04/29/2017 0254   HDL 38 (L) 04/29/2017 0254   CHOLHDL 3.2 04/29/2017 0254   VLDL 8 04/29/2017 0254   LDLCALC 74 04/29/2017 0254      Wt Readings from Last 3 Encounters:  05/30/17 175 lb (79.4 kg)  04/29/17 174 lb 12.8 oz (79.3 kg)  04/19/13 202 lb (91.6 kg)      Other studies Reviewed: Additional studies/ records that were reviewed  today include: ED records. Review of the above records demonstrates:  Please see elsewhere in the note.     ASSESSMENT AND PLAN:  ATRIAL FIB:   The patient had atrial fibrillation but has had no symptomatic recurrence of this and clearly is in regular rhythm today. He does not want to take blood thinners. It is possible that this was related to his hyperthyroidism which has been corrected. I gave him the name [facility] so that he can self monitor his rhythm. We talked  about the risk benefits of anticoagulation and for now he will stay off of anticoagulants after he's been informed.   He will let me know if he has any documented fibrillation.  Joshua Carney has a CHA2DS2 - VASc score of 5.  CAD:  The patient has no new sypmtoms.  No further cardiovascular testing is indicated.  We will continue with aggressive risk reduction and meds as listed.  CARDIOMYOPATHY:  EF was 55% on echo in May of this year.  No change in therapy is planned.   HTN:  The blood pressure is at target. No change in medications is indicated. We will continue with therapeutic lifestyle changes (TLC).  HYPERLIPIDEMIA:  He had an excellent lipid profile and I reviewed this today.    Current medicines are reviewed at length with the patient today.  The patient does not have concerns regarding medicines.  The following changes have been made:  no change  Labs/ tests ordered today include: None No orders of the defined types were placed in this encounter.    Disposition:   FU with me in 12 months.     Signed, [name], MD  05/30/2017 3:12 PM    [facility]

## 2017-05-30 ENCOUNTER — Encounter: Payer: Self-pay | Admitting: Cardiology

## 2017-05-30 ENCOUNTER — Ambulatory Visit (INDEPENDENT_AMBULATORY_CARE_PROVIDER_SITE_OTHER): Payer: Medicare Other | Admitting: Cardiology

## 2017-05-30 VITALS — BP 138/54 | HR 78 | Ht 69.5 in | Wt 175.0 lb

## 2017-05-30 DIAGNOSIS — I10 Essential (primary) hypertension: Secondary | ICD-10-CM | POA: Diagnosis not present

## 2017-05-30 DIAGNOSIS — I2510 Atherosclerotic heart disease of native coronary artery without angina pectoris: Secondary | ICD-10-CM

## 2017-05-30 DIAGNOSIS — E785 Hyperlipidemia, unspecified: Secondary | ICD-10-CM

## 2017-05-30 DIAGNOSIS — I480 Paroxysmal atrial fibrillation: Secondary | ICD-10-CM

## 2017-05-30 NOTE — Patient Instructions (Addendum)
Medication Instructions:  Continue current medications  Labwork: None Ordered  Testing/Procedures: None Ordered  Follow-Up: Your physician wants you to follow-up in: 1 Year. You will receive a reminder letter in the mail two months in advance. If you don't receive a letter, please call our office to schedule the follow-up appointment.    Any Other Special Instructions Will Be Listed Below (If Applicable).        [facility]ECOR  If you need a refill on your cardiac medications before your next appointment, please call your pharmacy.

## 2018-02-12 ENCOUNTER — Other Ambulatory Visit: Payer: Self-pay | Admitting: Family Medicine

## 2018-02-12 DIAGNOSIS — I4891 Unspecified atrial fibrillation: Secondary | ICD-10-CM

## 2018-04-15 ENCOUNTER — Ambulatory Visit (INDEPENDENT_AMBULATORY_CARE_PROVIDER_SITE_OTHER): Payer: Medicare Other

## 2018-04-15 DIAGNOSIS — I4891 Unspecified atrial fibrillation: Secondary | ICD-10-CM

## 2018-04-25 ENCOUNTER — Telehealth: Payer: Self-pay | Admitting: Physician Assistant

## 2018-04-25 NOTE — Telephone Encounter (Signed)
Addendum: wife called back to mention they are in [place] so will be going to nearest local hospital. Melina Copa PA-C

## 2018-04-25 NOTE — Telephone Encounter (Signed)
Received OP phone call from [place] today. Pt of Dr. Rosezella [place] with hx of Afib, last OV 05/2017 at which time patient elected to stop Eliquis. Event monitor appears to have been ordered by PCP. No notes available in Epic. [facility] called to notify that they saw first documentation of Afib HR 100-140, still ongoing. They tried to call patient with no answer. I tried both phone numbers with no answer. Given CHADSVASC score and ongoing AF, likely needs to go to the ER as he is not anticoagulated.  Tried multiple numbers again before finally reaching patient. He has no CP or SOB, only feels somewhat tired this AM. Advised to have someone drive him but he has no one - told patient that calling 911 was an option as well. He verbalized understanding and gratitude and plans to proceed.  Lysette Lindenbaum PA-C

## 2018-04-30 ENCOUNTER — Telehealth: Payer: Self-pay | Admitting: Cardiology

## 2018-04-30 NOTE — Telephone Encounter (Signed)
Received records from Dr. Marylene Land office on 04/30/18, Appt 05/04/18 @ 11:00AM. [place]

## 2018-05-01 ENCOUNTER — Other Ambulatory Visit: Payer: Self-pay | Admitting: Family Medicine

## 2018-05-01 DIAGNOSIS — R29898 Other symptoms and signs involving the musculoskeletal system: Secondary | ICD-10-CM

## 2018-05-01 DIAGNOSIS — R202 Paresthesia of skin: Secondary | ICD-10-CM

## 2018-05-03 ENCOUNTER — Ambulatory Visit
Admission: RE | Admit: 2018-05-03 | Discharge: 2018-05-03 | Disposition: A | Discharge status: Home / Self Care | Payer: Medicare Other | Source: Ambulatory Visit | Attending: Family Medicine | Admitting: Family Medicine

## 2018-05-03 DIAGNOSIS — R202 Paresthesia of skin: Secondary | ICD-10-CM

## 2018-05-03 DIAGNOSIS — R29898 Other symptoms and signs involving the musculoskeletal system: Secondary | ICD-10-CM

## 2018-05-03 MED ORDER — GADOBENATE DIMEGLUMINE 529 MG/ML IV SOLN
15.0000 mL | Freq: Once | INTRAVENOUS | Status: AC | PRN
  Administered 2018-05-03: 15 mL via INTRAVENOUS

## 2018-05-03 NOTE — Progress Notes (Signed)
Cardiology Office Note   Date:  05/04/2018   ID:  Joshua Carney, DOB January 04, 1934, MRN [id number]  PCP:  Derinda Late, MD  Cardiologist:   [name], MD     Chief Complaint  Patient presents with  . Atrial Fibrillation      History of Present Illness: Joshua Carney is a 84 y.o. male who presents for evaluation of atrial fib.  Previously he had a cardiomyopathy with left bundle branch block.  Cath demonstrated 25% LAD stenosis, 25% diagonal stenosis, 50% ramus intermediate stenosis, 95% stenosis in the AV groove leading into a possibly moderate sized marginal. The right coronary artery proximal 30% stenosis. The PDA had ostial 30% stenosis. The EF appeared to be about 55% on cath.  He was hospitalized in April of last year with atrial fib.  He was treated with rate control and started on Eliquis.  Echo showed normal EF.   When I saw him previously for this he did not want to continue anticoagulation.   Since I last saw him I have records from [facility] that indicates he was hospitalized with atrial fibrillation with rapid rate.  He also had an episode of leg weakness.  I have reviewed these records for this visit.  He had a negative CT angiogram of the head.  He had severe stenosis affecting the left internal carotid artery origin.   He reports that he has been wearing a monitor for screening purposes and was told that he had rapid heart rate and so went to the hospital at [place].  There he also mentioned a transient leg weakness which really had not bothered him.  He was started on Eliquis and reluctantly is taking this.  He does not feel any tachypalpitations.  He does not feel any presyncope or syncope.  He has no chest pressure, neck or arm discomfort.  He is active.  He did have a follow-up MRI that I reviewed and there were no acute abnormalities.    Past Medical History:  Diagnosis Date  . BPH (benign prostatic hyperplasia)   . Degenerative disc disease, cervical   . Hearing loss     . Hypertension   . Insomnia   . Internal hemorrhoids   . Left bundle branch block   . Left cervical radiculopathy   . Mixed hyperlipidemia   . Osteoarthritis   . Peripheral neuropathy     Past Surgical History:  Procedure Laterality Date  . BACK SURGERY     lumbar  . HAND SURGERY     wrestling injury  . HEMORRHOID SURGERY    . JOINT REPLACEMENT    . LEFT HEART CATHETERIZATION WITH CORONARY ANGIOGRAM N/A 01/21/2013   Procedure: LEFT HEART CATHETERIZATION WITH CORONARY ANGIOGRAM;  Surgeon: [name], MD;  Location: [facility] CATH LAB;  Service: Cardiovascular;  Laterality: N/A;  . TONSILLECTOMY AND ADENOIDECTOMY    . TOTAL KNEE ARTHROPLASTY     left     Current Outpatient Medications  Medication Sig Dispense Refill  . MAGNESIUM PO Take 700 mg by mouth daily.    . Melatonin 5 MG TABS Take 1 tablet by mouth daily.    Marland Kitchen ALPRAZolam (XANAX) 0.5 MG tablet Take 0.5 mg by mouth at bedtime as needed. For sleep    . amLODipine-benazepril (LOTREL) 5-20 MG per capsule Take 1 capsule by mouth daily.    . B Complex Vitamins (B COMPLEX 100 PO) Take 100 mg by mouth daily.    . Cholecalciferol (HM VITAMIN  D3) 2000 UNITS CAPS Take 5,000 Units by mouth daily.     . Coenzyme Q10 (EQL COQ10) 300 MG CAPS Take one tablet by mouth once daily    . ELIQUIS 5 MG TABS tablet Take 1 tablet by mouth 2 (two) times daily.  0  . Evening Primrose Oil 1000 MG CAPS Take 1,000 mg by mouth daily.    . hydroxypropyl methylcellulose / hypromellose (ISOPTO TEARS / GONIOVISC) 2.5 % ophthalmic solution Place 1 drop into both eyes as needed for dry eyes.    Marland Kitchen levothyroxine (SYNTHROID, LEVOTHROID) 25 MCG tablet Take 1 tablet (25 mcg total) by mouth daily before breakfast. 30 tablet 0  . metoprolol succinate (TOPROL XL) 50 MG 24 hr tablet Take 1 tablet (50 mg total) by mouth daily. Take with or immediately following a meal. 30 tablet 0  . Tamsulosin HCl (FLOMAX) 0.4 MG CAPS Take 0.4 mg by mouth daily.     No current  facility-administered medications for this visit.     Allergies:   Patient has no known allergies.    ROS:  Please see the history of present illness.   Otherwise, review of systems are positive for none.   All other systems are reviewed and negative.    PHYSICAL EXAM: VS:  BP (!) 147/68   Pulse 63   Ht 5\' 10"  (1.778 m)   Wt 183 lb 12.8 oz (83.4 kg)   BMI 26.37 kg/m  , BMI Body mass index is 26.37 kg/m.  GENERAL:  Well appearing NECK:  No jugular venous distention, waveform within normal limits, carotid upstroke brisk and symmetric, no bruits, no thyromegaly LUNGS:  Clear to auscultation bilaterally CHEST:  Unremarkable HEART:  PMI not displaced or sustained,S1 and S2 within normal limits, no S3, no S4, no clicks, no rubs, no murmurs ABD:  Flat, positive bowel sounds normal in frequency in pitch, no bruits, no rebound, no guarding, no midline pulsatile mass, no hepatomegaly, no splenomegaly EXT:  2 plus pulses throughout, no edema, no cyanosis no clubbing   EKG:  EKG is ordered today. Sinus rhythm, left bundle branch block, no acute ST-T wave changes.   Recent Labs: No results found for requested labs within last 8760 hours.    Lipid Panel    Component Value Date/Time   CHOL 120 04/29/2017 0254   TRIG 42 04/29/2017 0254   HDL 38 (L) 04/29/2017 0254   CHOLHDL 3.2 04/29/2017 0254   VLDL 8 04/29/2017 0254   LDLCALC 74 04/29/2017 0254      Wt Readings from Last 3 Encounters:  05/04/18 183 lb 12.8 oz (83.4 kg)  05/30/17 175 lb (79.4 kg)  04/29/17 174 lb 12.8 oz (79.3 kg)      Other studies Reviewed: Additional studies/ records that were reviewed today include: [facility] Records Review of the above records demonstrates:     ASSESSMENT AND PLAN:  ATRIAL FIB:     The patient has documented atrial fibrillation.  [name] has a CHA2DS2 - VASc score of 5.  He now reluctantly agrees to continue the Eliquis and I agree with this.  I would like to get a copy of his  completed event monitor when this is available so I can understand the burden of fibrillation.  In addition he has an apple watch which is set to alarm at rates greater than 120.  If he is having infrequent nonsustained paroxysms probably no further therapy would be indicated since he is asymptomatic.  CAD:  The patient has no new sypmtoms.  No further cardiovascular testing is indicated.  We will continue with aggressive risk reduction and meds as listed.  CARDIOMYOPATHY:  EF was 55% on echo in May of last year.   No change in therapy.   HTN:  The blood pressure is at target and he will remain on meds as listed.   HYPERLIPIDEMIA:  LDL was 74 last year.  Per Derinda Late, MD   CAROTID STENOSIS:  I noted this as above and asked the patient to check back with Derinda Late, MD to see about possible vascular surgery referral.     Current medicines are reviewed at length with the patient today.  The patient does not have concerns regarding medicines.  The following changes have been made:  None  Labs/ tests ordered today include:   Orders Placed This Encounter  Procedures  . EKG 12-Lead     Disposition:   FU with me in 2 months.     Signed, [name], MD  05/04/2018 12:31 PM    [facility]

## 2018-05-04 ENCOUNTER — Encounter: Payer: Self-pay | Admitting: Cardiology

## 2018-05-04 ENCOUNTER — Ambulatory Visit: Payer: Medicare Other | Admitting: Cardiology

## 2018-05-04 VITALS — BP 147/68 | HR 63 | Ht 70.0 in | Wt 183.8 lb

## 2018-05-04 DIAGNOSIS — I10 Essential (primary) hypertension: Secondary | ICD-10-CM

## 2018-05-04 DIAGNOSIS — Z0000 Encounter for general adult medical examination without abnormal findings: Secondary | ICD-10-CM

## 2018-05-04 DIAGNOSIS — E785 Hyperlipidemia, unspecified: Secondary | ICD-10-CM

## 2018-05-04 DIAGNOSIS — I4891 Unspecified atrial fibrillation: Secondary | ICD-10-CM | POA: Diagnosis not present

## 2018-05-04 NOTE — Patient Instructions (Addendum)
Medication Instructions:  Your physician recommends that you continue on your current medications as directed. Please refer to the Current Medication list given to you today.   Labwork: none  Testing/Procedures: none  Follow-Up: Your physician recommends that you schedule a follow-up appointment in: 2 months with Dr. Percival Spanish   Any Other Special Instructions Will Be Listed Below (If Applicable).   1 week of samples, Eliquis 5mg , given to patient.  If you need a refill on your cardiac medications before your next appointment, please call your pharmacy.

## 2018-05-06 ENCOUNTER — Other Ambulatory Visit: Payer: Self-pay

## 2018-05-06 DIAGNOSIS — I6523 Occlusion and stenosis of bilateral carotid arteries: Secondary | ICD-10-CM

## 2018-05-13 ENCOUNTER — Telehealth: Payer: Self-pay | Admitting: Cardiology

## 2018-05-13 NOTE — Telephone Encounter (Signed)
OK to travel.   

## 2018-05-13 NOTE — Telephone Encounter (Signed)
Spoke with pt who states he has an opportunity to travel out of the country to [place] and [place] in June for 3 weeks but wanted to get approval from Dr. Warren Lacy based on his heart condition.    Routed to MD

## 2018-05-13 NOTE — Telephone Encounter (Signed)
Pt is calling:   Pt has an opportunity to go [place] and [place], in June for 3 weeks pt will be going by himself  and want to ask Dr Percival Spanish if it would be safe for him to go based on his heart condition. Please advise and get back with pt.

## 2018-05-14 NOTE — Telephone Encounter (Signed)
Spoke with pt letting him know Dr Percival Spanish recommendation, pt voice understanding and thanks.

## 2018-05-26 ENCOUNTER — Other Ambulatory Visit: Payer: Self-pay | Admitting: Cardiology

## 2018-05-26 MED ORDER — ELIQUIS 5 MG PO TABS: 5.0000 mg | ORAL_TABLET | Freq: Two times a day (BID) | ORAL | 3 refills | Status: DC

## 2018-05-26 NOTE — Telephone Encounter (Signed)
Rx request sent to pharmacy.  

## 2018-05-26 NOTE — Telephone Encounter (Signed)
°*  STAT* If patient is at the pharmacy, call can be transferred to refill team.   1. Which medications need to be refilled? (please list name of each medication and dose if known) Eliquis 5mg   2. Which pharmacy/location (including street and city if local pharmacy) is medication to be sent to?[facility]  3. Do they need a 30 day or 90 day supply? 30 day   Please call Judeen Hammans if any questions.

## 2018-06-02 ENCOUNTER — Ambulatory Visit (HOSPITAL_COMMUNITY)
Admission: RE | Admit: 2018-06-02 | Discharge: 2018-06-02 | Disposition: A | Discharge status: Home / Self Care | Payer: Medicare Other | Source: Ambulatory Visit | Attending: Family | Admitting: Family

## 2018-06-02 DIAGNOSIS — I6523 Occlusion and stenosis of bilateral carotid arteries: Secondary | ICD-10-CM | POA: Insufficient documentation

## 2018-06-03 ENCOUNTER — Encounter: Payer: Self-pay | Admitting: Vascular Surgery

## 2018-06-03 ENCOUNTER — Ambulatory Visit: Payer: Medicare Other | Admitting: Vascular Surgery

## 2018-06-03 ENCOUNTER — Other Ambulatory Visit: Payer: Self-pay

## 2018-06-03 ENCOUNTER — Encounter

## 2018-06-03 VITALS — BP 131/65 | HR 58 | Temp 98.0°F | Resp 20 | Ht 70.0 in | Wt 185.0 lb

## 2018-06-03 DIAGNOSIS — I6522 Occlusion and stenosis of left carotid artery: Secondary | ICD-10-CM | POA: Diagnosis not present

## 2018-06-03 NOTE — Progress Notes (Signed)
Patient name: Joshua Carney MRN: [id number] DOB: 02/19/1934 Sex: male   REASON FOR CONSULT:    Left carotid stenosis.  The consult is requested by Dr. Derinda Late.   HPI:   Joshua Carney is a pleasant 84 y.o. male, who had developed transient left leg weakness.  He was admitted to [facility] in April for atrial fibrillation with a rapid ventricular response.  The CT scan was done during that admission.  His work-up included a CT angiogram of the neck which was done on 04/25/2018.  This showed evidence of a left carotid stenosis.  He was sent for carotid evaluation.  I have reviewed the records from the referring office.  The patient was seen on 05/15/2018.  Patient plans a trip to [place] and [place] in July.  He was being evaluated for a travel precautions.  He has been cleared from a cardiac standpoint.  Patient states that [place] was [place] the beach in early [place] when he was walking and suddenly it felt like his left leg was in the sand.  It is difficult for him to describe the specific symptoms but they lasted only briefly and then he walked 2 [address] home without any problem and later cut the grass.  He denies any previous history of stroke, TIAs, expressive or receptive aphasia, or amaurosis fugax.  He has a history of atrial fibrillation and is on Eliquis.  He does not take aspirin.  He has had no further symptoms.  He denies any history of claudication, rest pain, or nonhealing ulcers.  Past Medical History:  Diagnosis Date  . BPH (benign prostatic hyperplasia)   . Degenerative disc disease, cervical   . Hearing loss   . Hypertension   . Insomnia   . Internal hemorrhoids   . Left bundle branch block   . Left cervical radiculopathy   . Mixed hyperlipidemia   . Osteoarthritis   . Peripheral neuropathy     Family History  Problem Relation Age of Onset  . Hypertension Mother   . Hypertension Father   . Gallbladder disease Father   . Atrial fibrillation Sister   . High blood  pressure Sister     SOCIAL HISTORY: Social History   Socioeconomic History  . Marital status: Married    Spouse name: Not on file  . Number of children: 2  . Years of education: Not on file  . Highest education level: Not on file  Occupational History  . Not on file  Social Needs  . Financial resource strain: Not on file  . Food insecurity:    Worry: Not on file    Inability: Not on file  . Transportation needs:    Medical: Not on file    Non-medical: Not on file  Tobacco Use  . Smoking status: Former Smoker    Types: Cigarettes  . Smokeless tobacco: Never Used  . Tobacco comment: Light distant smoking history.  Substance and Sexual Activity  . Alcohol use: Yes    Alcohol/week: 0.5 oz    Types: 1 Standard drinks or equivalent per week  . Drug use: No  . Sexual activity: Not on file  Lifestyle  . Physical activity:    Days per week: Not on file    Minutes per session: Not on file  . Stress: Not on file  Relationships  . Social connections:    Talks on phone: Not on file    Gets together: Not on file  Attends religious service: Not on file    Active member of club or organization: Not on file    Attends meetings of clubs or organizations: Not on file    Relationship status: Not on file  . Intimate partner violence:    Fear of current or ex partner: Not on file    Emotionally abused: Not on file    Physically abused: Not on file    Forced sexual activity: Not on file  Other Topics Concern  . Not on file  Social History Narrative   Three grandchildren.  Lives at home with wife.     No Known Allergies  Current Outpatient Medications  Medication Sig Dispense Refill  . ALPRAZolam (XANAX) 0.5 MG tablet Take 0.5 mg by mouth at bedtime as needed. For sleep    . amLODipine-benazepril (LOTREL) 5-20 MG per capsule Take 1 capsule by mouth daily.    . B Complex Vitamins (B COMPLEX 100 PO) Take 100 mg by mouth daily.    . Cholecalciferol (HM VITAMIN D3) 2000 UNITS  CAPS Take 5,000 Units by mouth daily.     . Coenzyme Q10 (EQL COQ10) 300 MG CAPS Take one tablet by mouth once daily    . ELIQUIS 5 MG TABS tablet TAKE 1 TABLET BY MOUTH TWO TIMES A DAY 60 tablet 0  . ELIQUIS 5 MG TABS tablet Take 1 tablet (5 mg total) by mouth 2 (two) times daily. 60 tablet 3  . Evening Primrose Oil 1000 MG CAPS Take 1,000 mg by mouth daily.    . hydroxypropyl methylcellulose / hypromellose (ISOPTO TEARS / GONIOVISC) 2.5 % ophthalmic solution Place 1 drop into both eyes as needed for dry eyes.    Marland Kitchen levothyroxine (SYNTHROID, LEVOTHROID) 25 MCG tablet Take 1 tablet (25 mcg total) by mouth daily before breakfast. 30 tablet 0  . MAGNESIUM PO Take 700 mg by mouth daily.    . Melatonin 5 MG TABS Take 1 tablet by mouth daily.    . metoprolol succinate (TOPROL XL) 50 MG 24 hr tablet Take 1 tablet (50 mg total) by mouth daily. Take with or immediately following a meal. 30 tablet 0  . Tamsulosin HCl (FLOMAX) 0.4 MG CAPS Take 0.4 mg by mouth daily.     No current facility-administered medications for this visit.     REVIEW OF SYSTEMS:  [X]  denotes positive finding, [ ]  denotes negative finding Cardiac  Comments:  Chest pain or chest pressure:    Shortness of breath upon exertion: x   Short of breath when lying flat:    Irregular heart rhythm:        Vascular    Pain in calf, thigh, or hip brought on by ambulation:    Pain in feet at night that wakes you up from your sleep:     Blood clot in your veins:    Leg swelling:         Pulmonary    Oxygen at home:    Productive cough:     Wheezing:         Neurologic    Sudden weakness in arms or legs:     Sudden numbness in arms or legs:     Sudden onset of difficulty speaking or slurred speech:    Temporary loss of vision in one eye:     Problems with dizziness:         Gastrointestinal    Blood in stool:     Vomited blood:  Genitourinary    Burning when urinating:     Blood in urine:        Psychiatric      Major depression:         Hematologic    Bleeding problems:    Problems with blood clotting too easily:        Skin    Rashes or ulcers:        Constitutional    Fever or chills:     PHYSICAL EXAM:   Vitals:   06/03/18 1401 06/03/18 1408  BP: 138/64 131/65  Pulse: (!) 58   Resp: 20   Temp: 98 F (36.7 C)   TempSrc: Oral   SpO2: 94%   Weight: 185 lb (83.9 kg)   Height: 5\' 10"  (1.778 m)     GENERAL: The patient is a well-nourished male, in no acute distress. The vital signs are documented above. CARDIAC: There is a regular rate and rhythm.  VASCULAR: I do not detect carotid bruits. He has palpable femoral pulses and posterior tibial pulses bilaterally. PULMONARY: There is good air exchange bilaterally without wheezing or rales. ABDOMEN: Soft and non-tender with normal pitched bowel sounds.  I do not palpate an abdominal aortic aneurysm. MUSCULOSKELETAL: There are no major deformities or cyanosis. NEUROLOGIC: No focal weakness or paresthesias are detected. SKIN: There are no ulcers or rashes noted. PSYCHIATRIC: The patient has a normal affect.  DATA:    CT ANGIOGRAM NECK: I reviewed the CT angiogram of the neck that was done on 04/25/2018.  This showed that there appeared to be some narrowing in the left internal carotid artery.  This was noted to be in the 65 to 70% category.  On the right side there was no significant narrowing.  MR BRAIN: I also reviewed his MRI of the brain which was done on 05/03/2018.  This showed no acute intracranial abnormality.  CAROTID DUPLEX: I have independently interpreted his carotid duplex scan today.  He has a less than 39% carotid stenosis bilaterally.  Both vertebral arteries are patent with antegrade flow.  MEDICAL ISSUES:   LEFT CAROTID STENOSIS: By CT scan the patient was noted to have a moderate left carotid stenosis.  However by duplex there is no significant stenosis on either side.  I would rely more heavily on the duplex as I  think the CT scan is not an accurate test for determining the severity of the stenosis.  Regardless even if he had a moderate left carotid stenosis this would not explain his left leg symptoms.  I do not think further work-up is indicated at this point.  Given the discrepancy between the CT scan of the duplex I think it would be reasonable to obtain a carotid duplex scan in 1 year.  In addition I would consider 81 mg of aspirin also although he is on Eliquis and I will leave this up to Dr. Sandi Mariscal and Dr. Percival Spanish.  I will be happy to see him back at any time if any new vascular issues arise.  Deitra Mayo [facility] [phone]

## 2018-06-05 ENCOUNTER — Encounter: Payer: Self-pay | Admitting: Family Medicine

## 2018-07-16 NOTE — Progress Notes (Signed)
Cardiology Office Note   Date:  07/17/2018   ID:  [name], DOB Dec 30, 1934, MRN [id number]  PCP:  Derinda Late, MD  Cardiologist:   [name], MD     Chief Complaint  Patient presents with  . Atrial Fibrillation      History of Present Illness: Joshua Carney is a 84 y.o. male who presents for evaluation of atrial fib.  Previously he had a cardiomyopathy with left bundle branch block.  Cath demonstrated 25% LAD stenosis, 25% diagonal stenosis, 50% ramus intermediate stenosis, 95% stenosis in the AV groove leading into a possibly moderate sized marginal. The right coronary artery proximal 30% stenosis. The PDA had ostial 30% stenosis. The EF appeared to be about 55% on cath.  He was hospitalized in April of last year with atrial fib.  He was treated with rate control and started on Eliquis.  Echo showed normal EF.  He had a CT which suggested severe stenosis of the left internal carotid artery.  However, Doppler suggested mild stenosis.  Since I last saw him he saw Dr. Scot Dock.    Since I last saw him he has done well.  The patient denies any new symptoms such as chest discomfort, neck or arm discomfort. There has been no new shortness of breath, PND or orthopnea. There have been no reported palpitations, presyncope or syncope.   I did review an event monitor that demonstrated PAF less than 1% of the time.  The longest run was 48 minutes.  However, he wears a Apple watch and has never noticed any sustained rapid rates unless he is exercising.  He does not feel any palpitations.  He is felt well.  He walks for exercise.   Past Medical History:  Diagnosis Date  . BPH (benign prostatic hyperplasia)   . Degenerative disc disease, cervical   . Hearing loss   . Hypertension   . Insomnia   . Internal hemorrhoids   . Left bundle branch block   . Left cervical radiculopathy   . Mixed hyperlipidemia   . Osteoarthritis   . Peripheral neuropathy     Past Surgical History:    Procedure Laterality Date  . BACK SURGERY     lumbar  . HAND SURGERY     wrestling injury  . HEMORRHOID SURGERY    . JOINT REPLACEMENT    . LEFT HEART CATHETERIZATION WITH CORONARY ANGIOGRAM N/A 01/21/2013   Procedure: LEFT HEART CATHETERIZATION WITH CORONARY ANGIOGRAM;  Surgeon: [name], MD;  Location: [facility] CATH LAB;  Service: Cardiovascular;  Laterality: N/A;  . TONSILLECTOMY AND ADENOIDECTOMY    . TOTAL KNEE ARTHROPLASTY     left     Current Outpatient Medications  Medication Sig Dispense Refill  . ALPRAZolam (XANAX) 0.5 MG tablet Take 0.5 mg by mouth at bedtime as needed. For sleep    . amLODipine-benazepril (LOTREL) 5-20 MG per capsule Take 1 capsule by mouth daily.    . B Complex Vitamins (B COMPLEX 100 PO) Take 100 mg by mouth daily.    . Cholecalciferol (HM VITAMIN D3) 2000 UNITS CAPS Take 5,000 Units by mouth daily.     . Coenzyme Q10 (EQL COQ10) 300 MG CAPS Take one tablet by mouth once daily    . ELIQUIS 5 MG TABS tablet Take 1 tablet (5 mg total) by mouth 2 (two) times daily. 180 tablet 3  . Evening Primrose Oil 1000 MG CAPS Take 1,000 mg by mouth daily.    Marland Kitchen  hydroxypropyl methylcellulose / hypromellose (ISOPTO TEARS / GONIOVISC) 2.5 % ophthalmic solution Place 1 drop into both eyes as needed for dry eyes.    Marland Kitchen levothyroxine (SYNTHROID, LEVOTHROID) 25 MCG tablet Take 1 tablet (25 mcg total) by mouth daily before breakfast. 30 tablet 0  . MAGNESIUM PO Take 700 mg by mouth daily.    . Melatonin 5 MG TABS Take 1 tablet by mouth daily.    . metoprolol succinate (TOPROL XL) 50 MG 24 hr tablet Take 1 tablet (50 mg total) by mouth daily. Take with or immediately following a meal. 30 tablet 0  . Tamsulosin HCl (FLOMAX) 0.4 MG CAPS Take 0.4 mg by mouth daily.    . nitroGLYCERIN (NITROSTAT) 0.4 MG SL tablet Place 1 tablet (0.4 mg total) under the tongue every 5 (five) minutes as needed for chest pain. 25 tablet 1   No current facility-administered medications for this visit.      Allergies:   Patient has no known allergies.    ROS:  Please see the history of present illness.   Otherwise, review of systems are positive for none.   All other systems are reviewed and negative.    PHYSICAL EXAM: VS:  BP (!) 157/70   Pulse (!) 55   Ht 5\' 10"  (1.778 m)   Wt 184 lb 3.2 oz (83.6 kg)   BMI 26.43 kg/m  , BMI Body mass index is 26.43 kg/m.  GENERAL:  Well appearing NECK:  No jugular venous distention, waveform within normal limits, carotid upstroke brisk and symmetric, no bruits, no thyromegaly LUNGS:  Clear to auscultation bilaterally CHEST:  Unremarkable HEART:  PMI not displaced or sustained,S1 and S2 within normal limits, no S3, no S4, no clicks, no rubs, no murmurs ABD:  Flat, positive bowel sounds normal in frequency in pitch, no bruits, no rebound, no guarding, no midline pulsatile mass, no hepatomegaly, no splenomegaly EXT:  2 plus pulses throughout, no edema, no cyanosis no clubbing   EKG:  EKG is not ordered today.    Recent Labs: No results found for requested labs within last 8760 hours.    Lipid Panel    Component Value Date/Time   CHOL 120 04/29/2017 0254   TRIG 42 04/29/2017 0254   HDL 38 (L) 04/29/2017 0254   CHOLHDL 3.2 04/29/2017 0254   VLDL 8 04/29/2017 0254   LDLCALC 74 04/29/2017 0254      Wt Readings from Last 3 Encounters:  07/17/18 184 lb 3.2 oz (83.6 kg)  06/03/18 185 lb (83.9 kg)  05/04/18 183 lb 12.8 oz (83.4 kg)      Other studies Reviewed: Additional studies/ records that were reviewed today include:   Event recorder Review of the above records demonstrates:     ASSESSMENT AND PLAN:  ATRIAL FIB:     The patient has documented atrial fibrillation.  Mr. Joshua Carney has a CHA2DS2 - VASc score of 5.    He has paroxysms but they are not sustained rapid rates.  He is not symptomatic.  No change in therapy.  CAD:  The patient has no new sypmtoms.  No further cardiovascular testing is indicated.  We will continue  with aggressive risk reduction and meds as listed.  I did give him a prescription for 71.  I also reviewed with him risk benefits of aspirin.  I would suggest not continuing dual therapy I will treat him with Eliquis alone.  CARDIOMYOPATHY:  EF was 55% on echo in May of last  year.  No change in therapy.   HTN:  The blood pressure is elevated.  He will keep a BP diary.  I will suggest changes based on this result.   HYPERLIPIDEMIA:  LDL was 74 last year.  This is followed by  Derinda Late, MD   CAROTID STENOSIS:   This can be followed by Dr. Sandi Mariscal going forward with carotid Dopplers.    The degree of stenosis was overestimated by CT.     Current medicines are reviewed at length with the patient today.  The patient does not have concerns regarding medicines.  The following changes have been made:  As above  Labs/ tests ordered today include:  None  No orders of the defined types were placed in this encounter.    Disposition:   FU with me in 12 months.     Signed, [name], MD  07/17/2018 8:42 AM    [facility]

## 2018-07-17 ENCOUNTER — Ambulatory Visit: Payer: Medicare Other | Admitting: Cardiology

## 2018-07-17 ENCOUNTER — Encounter: Payer: Self-pay | Admitting: Cardiology

## 2018-07-17 VITALS — BP 157/70 | HR 55 | Ht 70.0 in | Wt 184.2 lb

## 2018-07-17 DIAGNOSIS — I2510 Atherosclerotic heart disease of native coronary artery without angina pectoris: Secondary | ICD-10-CM | POA: Diagnosis not present

## 2018-07-17 DIAGNOSIS — E785 Hyperlipidemia, unspecified: Secondary | ICD-10-CM | POA: Diagnosis not present

## 2018-07-17 DIAGNOSIS — I10 Essential (primary) hypertension: Secondary | ICD-10-CM

## 2018-07-17 DIAGNOSIS — I480 Paroxysmal atrial fibrillation: Secondary | ICD-10-CM | POA: Diagnosis not present

## 2018-07-17 MED ORDER — NITROGLYCERIN 0.4 MG SL SUBL: 0.4000 mg | SUBLINGUAL_TABLET | SUBLINGUAL | 1 refills | Status: DC | PRN

## 2018-07-17 MED ORDER — ELIQUIS 5 MG PO TABS: 5.0000 mg | ORAL_TABLET | Freq: Two times a day (BID) | ORAL | 3 refills | Status: DC

## 2018-07-17 NOTE — Patient Instructions (Signed)
Medication Instructions:  STOP- Aspirin 81 mg  If you need a refill on your cardiac medications before your next appointment, please call your pharmacy.  Labwork: None Ordered   Testing/Procedures: None Ordered  Follow-Up: Your physician wants you to follow-up in: 1 Year. You should receive a reminder letter in the mail two months in advance. If you do not receive a letter, please call our office [phone].      Thank you for choosing [facility] at [facility]!!

## 2018-09-17 ENCOUNTER — Other Ambulatory Visit: Payer: Self-pay | Admitting: Sports Medicine

## 2018-09-17 ENCOUNTER — Ambulatory Visit
Admission: RE | Admit: 2018-09-17 | Discharge: 2018-09-17 | Disposition: A | Discharge status: Home / Self Care | Payer: Medicare Other | Source: Ambulatory Visit | Attending: Sports Medicine | Admitting: Sports Medicine

## 2018-09-17 DIAGNOSIS — M25551 Pain in right hip: Secondary | ICD-10-CM

## 2018-09-18 ENCOUNTER — Telehealth: Payer: Self-pay | Admitting: Cardiology

## 2018-09-18 NOTE — Telephone Encounter (Signed)
New Message       [facility] Pre-operative Risk Assessment    Request for surgical clearance:  1. What type of surgery is being performed? Hip Fracture  2. When is this surgery scheduled? 09/22/18  3. What type of clearance is required (medical clearance vs. Pharmacy clearance to hold med vs. Both)? Both  4. Are there any medications that need to be held prior to surgery and how long? Hold Eliquis    5. Practice name and name of physician performing surgery? [facility] Dr. Alvan Dame  6. What is your office phone number [phone]    7.   What is your office fax number [phone]  8.   Anesthesia type (None, local, MAC, general) ? spinal   Joshua Carney 09/18/2018, 12:49 PM  _________________________________________________________________   (provider comments below)

## 2018-09-18 NOTE — Telephone Encounter (Signed)
Pt takes Eliquis for afib with CHADS2VASc score of 5 (age x2, CHF, HTN, CAD). Renal function is normal. Recommend holding Eliquis for 3 days prior to procedure since spinal anesthesia will be used.

## 2018-09-18 NOTE — H&P (Signed)
Joshua Carney is an 84 y.o. male.    Chief Complaint:     Right impacted subcapital/high femoral neck fracture  Procedure:   ORIF right subcapital hip fracture   HPI: Pt is a 84 y.o. male complaining of right hip pain for days.  He comes in today for an evaluation after an acute injury. He essentially fell when he was going down the steps and missed the last one. He landed more on the right side of his body. He began having pain on the outside and buttock region about his right hip. He does have pain with ambulation. He had been walking with a cane, but is now in a wheelchair.  CT scan was obtained which revealed a impacted subcapital/high femoral neck fracture.  Dr. Alvan Dame had a discussion with the patient and he wishes to proceed with surgery.Risks, benefits and expectations were discussed with the patient.  Risks including but not limited to the risk of anesthesia, blood clots, nerve damage, blood vessel damage, failure of the prosthesis, infection and up to and including death.  Patient understand the risks, benefits and expectations and wishes to proceed with surgery.    PCP: Derinda Late, MD  D/C Plans:       Home  Post-op Meds:       No Rx given   Tranexamic Acid:      To be given - IV   Decadron:      Is to be given  FYI:     ASA  Norco  DME:   Pt already has equipment   PT:    No PT    PMH: Past Medical History:  Diagnosis Date  . BPH (benign prostatic hyperplasia)   . Degenerative disc disease, cervical   . Hearing loss   . Hypertension   . Insomnia   . Internal hemorrhoids   . Left bundle branch block   . Left cervical radiculopathy   . Mixed hyperlipidemia   . Osteoarthritis   . Peripheral neuropathy     PSH: Past Surgical History:  Procedure Laterality Date  . BACK SURGERY     lumbar  . HAND SURGERY     wrestling injury  . HEMORRHOID SURGERY    . JOINT REPLACEMENT    . LEFT HEART CATHETERIZATION WITH CORONARY ANGIOGRAM N/A 01/21/2013   Procedure: LEFT HEART CATHETERIZATION WITH CORONARY ANGIOGRAM;  Surgeon: [name], MD;  Location: [facility] CATH LAB;  Service: Cardiovascular;  Laterality: N/A;  . TONSILLECTOMY AND ADENOIDECTOMY    . TOTAL KNEE ARTHROPLASTY     left    Social History:  reports that he has quit smoking. His smoking use included cigarettes. He has never used smokeless tobacco. He reports that he drinks about 1.0 standard drinks of alcohol per week. He reports that he does not use drugs.  Allergies:  No Known Allergies  Medications: No current facility-administered medications for this encounter.    Current Outpatient Medications  Medication Sig Dispense Refill  . ALPRAZolam (XANAX) 0.5 MG tablet Take 0.5 mg by mouth at bedtime as needed. For sleep    . amLODipine-benazepril (LOTREL) 5-20 MG per capsule Take 1 capsule by mouth daily.    . B Complex Vitamins (B COMPLEX 100 PO) Take 100 mg by mouth daily.    . Cholecalciferol (HM VITAMIN D3) 2000 UNITS CAPS Take 5,000 Units by mouth daily.     . Coenzyme Q10 (EQL COQ10) 300 MG CAPS Take one tablet by mouth once  daily    . ELIQUIS 5 MG TABS tablet Take 1 tablet (5 mg total) by mouth 2 (two) times daily. 180 tablet 3  . Evening Primrose Oil 1000 MG CAPS Take 1,000 mg by mouth daily.    . hydroxypropyl methylcellulose / hypromellose (ISOPTO TEARS / GONIOVISC) 2.5 % ophthalmic solution Place 1 drop into both eyes as needed for dry eyes.    Marland Kitchen levothyroxine (SYNTHROID, LEVOTHROID) 25 MCG tablet Take 1 tablet (25 mcg total) by mouth daily before breakfast. 30 tablet 0  . MAGNESIUM PO Take 700 mg by mouth daily.    . Melatonin 5 MG TABS Take 1 tablet by mouth daily.    . metoprolol succinate (TOPROL XL) 50 MG 24 hr tablet Take 1 tablet (50 mg total) by mouth daily. Take with or immediately following a meal. 30 tablet 0  . nitroGLYCERIN (NITROSTAT) 0.4 MG SL tablet Place 1 tablet (0.4 mg total) under the tongue every 5 (five) minutes as needed for chest pain. 25  tablet 1  . Tamsulosin HCl (FLOMAX) 0.4 MG CAPS Take 0.4 mg by mouth daily.      No results found for this or any previous visit (from the past 48 hour(s)). Ct Hip Right Wo Contrast  Result Date: 09/17/2018 CLINICAL DATA:  [place] 2 days ago.  Persistent right hip pain. EXAM: CT OF THE RIGHT HIP WITHOUT CONTRAST TECHNIQUE: Multidetector CT imaging of the right hip was performed according to the standard protocol. Multiplanar CT image reconstructions were also generated. COMPARISON:  None. FINDINGS: There is an impacted subcapital/high femoral neck fracture with slight external rotation of the femoral head in relation to the femoral neck. Maximum impaction is approximately 5.5 mm laterally. No intertrochanteric fracture. The acetabulum is intact. There are moderate degenerative changes involving the hip. The pubic symphysis and visualized portion of the right SI joint are intact. No right-sided pelvic fractures are identified. No significant intrapelvic abnormalities. Mild prostate gland enlargement. Small right inguinal hernia containing fat. No obvious muscle injury or intramuscular hematoma. IMPRESSION: 1. Impacted subcapital/high femoral neck fracture of the right hip. 2. No pelvic fractures. These results will be called to the ordering clinician or representative by the Radiologist Assistant, and communication documented in the PACS or zVision Dashboard. Electronically Signed   By: Marijo Sanes M.D.   On: 09/17/2018 08:55     Review of Systems  Constitutional: Negative.   HENT: Positive for hearing loss.   Eyes: Negative.   Respiratory: Negative.   Cardiovascular: Negative.   Gastrointestinal: Negative.   Genitourinary: Negative.   Musculoskeletal: Positive for joint pain.  Skin: Negative.   Neurological: Negative.   Endo/Heme/Allergies: Negative.   Psychiatric/Behavioral: The patient has insomnia.        Physical Exam  Constitutional: He is oriented to person, place, and time. He  appears well-developed.  HENT:  Head: Normocephalic.  Eyes: Pupils are equal, round, and reactive to light.  Neck: Neck supple. No JVD present. No tracheal deviation present. No thyromegaly present.  Cardiovascular: Normal rate, regular rhythm and intact distal pulses.  Respiratory: Effort normal and breath sounds normal. No respiratory distress. He has no wheezes.  GI: Soft. There is no tenderness. There is no guarding.  Musculoskeletal:       Right hip: He exhibits decreased range of motion, decreased strength, tenderness and bony tenderness. He exhibits no swelling, no deformity and no laceration.  Lymphadenopathy:    He has no cervical adenopathy.  Neurological: He is alert and oriented to  person, place, and time.  Skin: Skin is warm and dry.  Psychiatric: He has a normal mood and affect.       Assessment/Plan Assessment:   Right impacted subcapital/high femoral neck fracture  Plan: Patient will undergo a ORIF right subcapital hip fracture on 09/22/2018 per Dr. Alvan Dame at [facility]. Risks benefits and expectations were discussed with the patient. Patient understand risks, benefits and expectations and wishes to proceed.     [place] Ariyanah Aguado   PA-C  09/18/2018, 1:06 PM

## 2018-09-18 NOTE — Telephone Encounter (Signed)
   Primary Cardiologist: [name], MD  Chart reviewed as part of pre-operative protocol coverage. Patient was contacted 09/18/2018 in reference to pre-operative risk assessment for pending surgery as outlined below.  Joshua Carney was last seen on 07/17/18 by Dr. Percival Spanish.  Since that day, Joshua Carney has done well w/o cardiac symptoms. No CP or dyspnea.   Per pharmacy, Recommend holding Eliquis for 3 days prior to procedure since spinal anesthesia will be used.  Therefore, based on ACC/AHA guidelines, the patient would be at acceptable risk for the planned procedure without further cardiovascular testing.   I will route this recommendation to the requesting party via Epic fax function and remove from pre-op pool.  Please call with questions.  Lyda Jester, PA-C 09/18/2018, 3:51 PM

## 2018-09-21 ENCOUNTER — Other Ambulatory Visit: Payer: Self-pay

## 2018-09-21 ENCOUNTER — Encounter (HOSPITAL_COMMUNITY): Payer: Self-pay | Admitting: *Deleted

## 2018-09-22 ENCOUNTER — Encounter (HOSPITAL_COMMUNITY)
Admission: RE | Disposition: A | Discharge status: Home Health | Payer: Self-pay | Source: Ambulatory Visit | Attending: Orthopedic Surgery

## 2018-09-22 ENCOUNTER — Inpatient Hospital Stay (HOSPITAL_COMMUNITY)
Admission: RE | Admit: 2018-09-22 | Discharge: 2018-09-23 | DRG: 482 | Disposition: A | Discharge status: Home Health | Payer: Medicare Other | Source: Ambulatory Visit | Attending: Orthopedic Surgery | Admitting: Orthopedic Surgery

## 2018-09-22 ENCOUNTER — Inpatient Hospital Stay (HOSPITAL_COMMUNITY): Payer: Medicare Other

## 2018-09-22 ENCOUNTER — Inpatient Hospital Stay (HOSPITAL_COMMUNITY): Payer: Medicare Other | Admitting: Anesthesiology

## 2018-09-22 ENCOUNTER — Encounter (HOSPITAL_COMMUNITY): Payer: Self-pay | Admitting: Emergency Medicine

## 2018-09-22 ENCOUNTER — Other Ambulatory Visit: Payer: Self-pay

## 2018-09-22 DIAGNOSIS — G4700 Insomnia, unspecified: Secondary | ICD-10-CM | POA: Diagnosis present

## 2018-09-22 DIAGNOSIS — N400 Enlarged prostate without lower urinary tract symptoms: Secondary | ICD-10-CM | POA: Diagnosis present

## 2018-09-22 DIAGNOSIS — E663 Overweight: Secondary | ICD-10-CM | POA: Diagnosis present

## 2018-09-22 DIAGNOSIS — Z7901 Long term (current) use of anticoagulants: Secondary | ICD-10-CM | POA: Diagnosis not present

## 2018-09-22 DIAGNOSIS — I4891 Unspecified atrial fibrillation: Secondary | ICD-10-CM | POA: Diagnosis present

## 2018-09-22 DIAGNOSIS — I447 Left bundle-branch block, unspecified: Secondary | ICD-10-CM | POA: Diagnosis present

## 2018-09-22 DIAGNOSIS — W109XXA Fall (on) (from) unspecified stairs and steps, initial encounter: Secondary | ICD-10-CM | POA: Diagnosis present

## 2018-09-22 DIAGNOSIS — S72011A Unspecified intracapsular fracture of right femur, initial encounter for closed fracture: Secondary | ICD-10-CM | POA: Diagnosis present

## 2018-09-22 DIAGNOSIS — E039 Hypothyroidism, unspecified: Secondary | ICD-10-CM | POA: Diagnosis present

## 2018-09-22 DIAGNOSIS — Z96652 Presence of left artificial knee joint: Secondary | ICD-10-CM | POA: Diagnosis present

## 2018-09-22 DIAGNOSIS — Z79899 Other long term (current) drug therapy: Secondary | ICD-10-CM

## 2018-09-22 DIAGNOSIS — Z87891 Personal history of nicotine dependence: Secondary | ICD-10-CM

## 2018-09-22 DIAGNOSIS — M25551 Pain in right hip: Secondary | ICD-10-CM

## 2018-09-22 DIAGNOSIS — Z6826 Body mass index (BMI) 26.0-26.9, adult: Secondary | ICD-10-CM | POA: Diagnosis not present

## 2018-09-22 DIAGNOSIS — I10 Essential (primary) hypertension: Secondary | ICD-10-CM | POA: Diagnosis present

## 2018-09-22 DIAGNOSIS — H9190 Unspecified hearing loss, unspecified ear: Secondary | ICD-10-CM | POA: Diagnosis present

## 2018-09-22 DIAGNOSIS — S72001A Fracture of unspecified part of neck of right femur, initial encounter for closed fracture: Secondary | ICD-10-CM | POA: Diagnosis present

## 2018-09-22 HISTORY — PX: HIP PINNING,CANNULATED: SHX1758

## 2018-09-22 HISTORY — DX: Personal history of urinary calculi: Z87.442

## 2018-09-22 HISTORY — DX: Hypothyroidism, unspecified: E03.9

## 2018-09-22 LAB — BASIC METABOLIC PANEL
Anion gap: 8 (ref 5–15)
BUN: 18 mg/dL (ref 8–23)
CALCIUM: 9 mg/dL (ref 8.9–10.3)
CO2: 26 mmol/L (ref 22–32)
Chloride: 103 mmol/L (ref 98–111)
Creatinine, Ser: 0.51 mg/dL — ABNORMAL LOW (ref 0.61–1.24)
Glucose, Bld: 112 mg/dL — ABNORMAL HIGH (ref 70–99)
Potassium: 4 mmol/L (ref 3.5–5.1)
Sodium: 137 mmol/L (ref 135–145)

## 2018-09-22 LAB — CBC
HEMATOCRIT: 43.4 % (ref 39.0–52.0)
Hemoglobin: 15.2 g/dL (ref 13.0–17.0)
MCH: 31.9 pg (ref 26.0–34.0)
MCHC: 35 g/dL (ref 30.0–36.0)
MCV: 91.2 fL (ref 78.0–100.0)
PLATELETS: 247 10*3/uL (ref 150–400)
RBC: 4.76 MIL/uL (ref 4.22–5.81)
RDW: 13 % (ref 11.5–15.5)
WBC: 7.8 10*3/uL (ref 4.0–10.5)

## 2018-09-22 LAB — TYPE AND SCREEN
ABO/RH(D): A POS
Antibody Screen: NEGATIVE

## 2018-09-22 SURGERY — FIXATION, FEMUR, NECK, PERCUTANEOUS, USING SCREW
Anesthesia: Spinal | Site: Hip | Laterality: Right

## 2018-09-22 MED ORDER — POLYETHYLENE GLYCOL 3350 17 G PO PACK: 17.0000 g | PACK | Freq: Two times a day (BID) | ORAL | 0 refills | Status: DC

## 2018-09-22 MED ORDER — ACETAMINOPHEN 325 MG PO TABS: 325.0000 mg | ORAL_TABLET | Freq: Four times a day (QID) | ORAL | Status: DC | PRN

## 2018-09-22 MED ORDER — METHOCARBAMOL 500 MG IVPB - SIMPLE MED
500.0000 mg | Freq: Four times a day (QID) | INTRAVENOUS | Status: DC | PRN
  Filled 2018-09-22: qty 50

## 2018-09-22 MED ORDER — TRANEXAMIC ACID 1000 MG/10ML IV SOLN
1000.0000 mg | INTRAVENOUS | Status: AC
  Administered 2018-09-22: 1000 mg via INTRAVENOUS
  Filled 2018-09-22: qty 10

## 2018-09-22 MED ORDER — BUPIVACAINE IN DEXTROSE 0.75-8.25 % IT SOLN
INTRATHECAL | Status: DC | PRN
  Administered 2018-09-22: 1.8 mL via INTRATHECAL

## 2018-09-22 MED ORDER — DEXAMETHASONE SODIUM PHOSPHATE 10 MG/ML IJ SOLN: 10.0000 mg | Freq: Once | INTRAMUSCULAR | Status: DC

## 2018-09-22 MED ORDER — ONDANSETRON HCL 4 MG PO TABS: 4.0000 mg | ORAL_TABLET | Freq: Four times a day (QID) | ORAL | Status: DC | PRN

## 2018-09-22 MED ORDER — TRANEXAMIC ACID 1000 MG/10ML IV SOLN
1000.0000 mg | Freq: Once | INTRAVENOUS | Status: AC
  Administered 2018-09-22: 1000 mg via INTRAVENOUS
  Filled 2018-09-22: qty 1000

## 2018-09-22 MED ORDER — FENTANYL CITRATE (PF) 100 MCG/2ML IJ SOLN
INTRAMUSCULAR | Status: AC
  Filled 2018-09-22: qty 2

## 2018-09-22 MED ORDER — CHLORHEXIDINE GLUCONATE 4 % EX LIQD: 60.0000 mL | Freq: Once | CUTANEOUS | Status: DC

## 2018-09-22 MED ORDER — PROPOFOL 500 MG/50ML IV EMUL
INTRAVENOUS | Status: DC | PRN
  Administered 2018-09-22: 50 ug/kg/min via INTRAVENOUS

## 2018-09-22 MED ORDER — HYDROCODONE-ACETAMINOPHEN 7.5-325 MG PO TABS: 1.0000 | ORAL_TABLET | ORAL | Status: DC | PRN

## 2018-09-22 MED ORDER — ALUM & MAG HYDROXIDE-SIMETH 200-200-20 MG/5ML PO SUSP: 15.0000 mL | ORAL | Status: DC | PRN

## 2018-09-22 MED ORDER — APIXABAN 2.5 MG PO TABS
2.5000 mg | ORAL_TABLET | Freq: Two times a day (BID) | ORAL | Status: DC
  Administered 2018-09-23: 2.5 mg via ORAL
  Filled 2018-09-22: qty 1

## 2018-09-22 MED ORDER — ALPRAZOLAM 0.5 MG PO TABS
0.5000 mg | ORAL_TABLET | Freq: Every evening | ORAL | Status: DC | PRN
  Administered 2018-09-22: 0.5 mg via ORAL
  Filled 2018-09-22: qty 1

## 2018-09-22 MED ORDER — CEFAZOLIN SODIUM-DEXTROSE 2-4 GM/100ML-% IV SOLN
2.0000 g | INTRAVENOUS | Status: AC
  Administered 2018-09-22: 2 g via INTRAVENOUS
  Filled 2018-09-22: qty 100

## 2018-09-22 MED ORDER — POLYVINYL ALCOHOL 1.4 % OP SOLN
1.0000 [drp] | Freq: Every day | OPHTHALMIC | Status: DC | PRN
  Filled 2018-09-22: qty 15

## 2018-09-22 MED ORDER — ONDANSETRON HCL 4 MG/2ML IJ SOLN
INTRAMUSCULAR | Status: DC | PRN
  Administered 2018-09-22: 4 mg via INTRAVENOUS

## 2018-09-22 MED ORDER — CELECOXIB 200 MG PO CAPS
200.0000 mg | ORAL_CAPSULE | Freq: Two times a day (BID) | ORAL | Status: DC
  Administered 2018-09-22 – 2018-09-23 (×2): 200 mg via ORAL
  Filled 2018-09-22 (×2): qty 1

## 2018-09-22 MED ORDER — MENTHOL 3 MG MT LOZG: 1.0000 | LOZENGE | OROMUCOSAL | Status: DC | PRN

## 2018-09-22 MED ORDER — METOCLOPRAMIDE HCL 5 MG/ML IJ SOLN: 5.0000 mg | Freq: Three times a day (TID) | INTRAMUSCULAR | Status: DC | PRN

## 2018-09-22 MED ORDER — NITROGLYCERIN 0.4 MG SL SUBL: 0.4000 mg | SUBLINGUAL_TABLET | SUBLINGUAL | Status: DC | PRN

## 2018-09-22 MED ORDER — METOCLOPRAMIDE HCL 5 MG PO TABS: 5.0000 mg | ORAL_TABLET | Freq: Three times a day (TID) | ORAL | Status: DC | PRN

## 2018-09-22 MED ORDER — DOCUSATE SODIUM 100 MG PO CAPS
100.0000 mg | ORAL_CAPSULE | Freq: Two times a day (BID) | ORAL | Status: DC
  Administered 2018-09-22 – 2018-09-23 (×2): 100 mg via ORAL
  Filled 2018-09-22 (×2): qty 1

## 2018-09-22 MED ORDER — FERROUS SULFATE 325 (65 FE) MG PO TABS
325.0000 mg | ORAL_TABLET | Freq: Three times a day (TID) | ORAL | Status: DC
  Administered 2018-09-23: 325 mg via ORAL
  Filled 2018-09-22: qty 1

## 2018-09-22 MED ORDER — FENTANYL CITRATE (PF) 100 MCG/2ML IJ SOLN
INTRAMUSCULAR | Status: DC | PRN
  Administered 2018-09-22: 50 ug via INTRAVENOUS

## 2018-09-22 MED ORDER — METOPROLOL SUCCINATE ER 50 MG PO TB24
50.0000 mg | ORAL_TABLET | Freq: Every day | ORAL | Status: DC
  Administered 2018-09-23: 50 mg via ORAL
  Filled 2018-09-22: qty 1

## 2018-09-22 MED ORDER — DEXAMETHASONE SODIUM PHOSPHATE 10 MG/ML IJ SOLN
INTRAMUSCULAR | Status: AC
  Filled 2018-09-22: qty 1

## 2018-09-22 MED ORDER — POLYETHYLENE GLYCOL 3350 17 G PO PACK
17.0000 g | PACK | Freq: Two times a day (BID) | ORAL | Status: DC
  Administered 2018-09-22 – 2018-09-23 (×2): 17 g via ORAL
  Filled 2018-09-22 (×2): qty 1

## 2018-09-22 MED ORDER — DOCUSATE SODIUM 100 MG PO CAPS: 100.0000 mg | ORAL_CAPSULE | Freq: Two times a day (BID) | ORAL | 0 refills | Status: DC

## 2018-09-22 MED ORDER — MORPHINE SULFATE (PF) 4 MG/ML IV SOLN: 1.0000 mg | INTRAVENOUS | Status: DC | PRN

## 2018-09-22 MED ORDER — METHOCARBAMOL 500 MG PO TABS: 500.0000 mg | ORAL_TABLET | Freq: Four times a day (QID) | ORAL | 0 refills | Status: DC | PRN

## 2018-09-22 MED ORDER — SODIUM CHLORIDE 0.9 % IV SOLN
INTRAVENOUS | Status: DC
  Administered 2018-09-22: 19:00:00 via INTRAVENOUS

## 2018-09-22 MED ORDER — ONDANSETRON HCL 4 MG/2ML IJ SOLN
INTRAMUSCULAR | Status: AC
  Filled 2018-09-22: qty 2

## 2018-09-22 MED ORDER — MORPHINE SULFATE (PF) 2 MG/ML IV SOLN: 0.5000 mg | INTRAVENOUS | Status: DC | PRN

## 2018-09-22 MED ORDER — HYPROMELLOSE (GONIOSCOPIC) 2.5 % OP SOLN: 1.0000 [drp] | Freq: Every day | OPHTHALMIC | Status: DC | PRN

## 2018-09-22 MED ORDER — PROMETHAZINE HCL 25 MG/ML IJ SOLN: 6.2500 mg | INTRAMUSCULAR | Status: DC | PRN

## 2018-09-22 MED ORDER — MELATONIN 5 MG PO TABS
5.0000 mg | ORAL_TABLET | Freq: Every day | ORAL | Status: DC
  Administered 2018-09-22: 5 mg via ORAL
  Filled 2018-09-22: qty 1

## 2018-09-22 MED ORDER — AMLODIPINE BESYLATE 5 MG PO TABS
5.0000 mg | ORAL_TABLET | Freq: Every day | ORAL | Status: DC
  Administered 2018-09-22 – 2018-09-23 (×2): 5 mg via ORAL
  Filled 2018-09-22: qty 1

## 2018-09-22 MED ORDER — DEXAMETHASONE SODIUM PHOSPHATE 10 MG/ML IJ SOLN
10.0000 mg | Freq: Once | INTRAMUSCULAR | Status: AC
  Administered 2018-09-22: 10 mg via INTRAVENOUS

## 2018-09-22 MED ORDER — PROPOFOL 10 MG/ML IV BOLUS
INTRAVENOUS | Status: DC | PRN
  Administered 2018-09-22: 20 mg via INTRAVENOUS
  Administered 2018-09-22: 10 mg via INTRAVENOUS

## 2018-09-22 MED ORDER — 0.9 % SODIUM CHLORIDE (POUR BTL) OPTIME
TOPICAL | Status: DC | PRN
  Administered 2018-09-22: 1000 mL

## 2018-09-22 MED ORDER — LEVOTHYROXINE SODIUM 75 MCG PO TABS
37.5000 ug | ORAL_TABLET | ORAL | Status: DC
  Administered 2018-09-22: 37.5 ug via ORAL

## 2018-09-22 MED ORDER — ONDANSETRON HCL 4 MG/2ML IJ SOLN: 4.0000 mg | Freq: Four times a day (QID) | INTRAMUSCULAR | Status: DC | PRN

## 2018-09-22 MED ORDER — BISACODYL 10 MG RE SUPP: 10.0000 mg | Freq: Every day | RECTAL | Status: DC | PRN

## 2018-09-22 MED ORDER — HYDROCODONE-ACETAMINOPHEN 5-325 MG PO TABS
1.0000 | ORAL_TABLET | ORAL | Status: DC | PRN
  Administered 2018-09-22 – 2018-09-23 (×2): 1 via ORAL
  Filled 2018-09-22 (×2): qty 1

## 2018-09-22 MED ORDER — METHOCARBAMOL 500 MG PO TABS: 500.0000 mg | ORAL_TABLET | Freq: Four times a day (QID) | ORAL | Status: DC | PRN

## 2018-09-22 MED ORDER — CEFAZOLIN SODIUM-DEXTROSE 2-4 GM/100ML-% IV SOLN
2.0000 g | Freq: Four times a day (QID) | INTRAVENOUS | Status: AC
  Administered 2018-09-22 – 2018-09-23 (×2): 2 g via INTRAVENOUS
  Filled 2018-09-22 (×2): qty 100

## 2018-09-22 MED ORDER — DIPHENHYDRAMINE HCL 12.5 MG/5ML PO ELIX: 12.5000 mg | ORAL_SOLUTION | ORAL | Status: DC | PRN

## 2018-09-22 MED ORDER — LACTATED RINGERS IV SOLN
INTRAVENOUS | Status: DC
  Administered 2018-09-22: 13:00:00 via INTRAVENOUS

## 2018-09-22 MED ORDER — FERROUS SULFATE 325 (65 FE) MG PO TABS: 325.0000 mg | ORAL_TABLET | Freq: Three times a day (TID) | ORAL | 3 refills | Status: DC

## 2018-09-22 MED ORDER — PROPOFOL 10 MG/ML IV BOLUS
INTRAVENOUS | Status: AC
  Filled 2018-09-22: qty 40

## 2018-09-22 MED ORDER — PHENOL 1.4 % MT LIQD
1.0000 | OROMUCOSAL | Status: DC | PRN
  Filled 2018-09-22: qty 177

## 2018-09-22 MED ORDER — MAGNESIUM CITRATE PO SOLN: 1.0000 | Freq: Once | ORAL | Status: DC | PRN

## 2018-09-22 MED ORDER — HYDROCODONE-ACETAMINOPHEN 7.5-325 MG PO TABS: 1.0000 | ORAL_TABLET | ORAL | 0 refills | Status: DC | PRN

## 2018-09-22 MED ORDER — LOSARTAN POTASSIUM 50 MG PO TABS
100.0000 mg | ORAL_TABLET | Freq: Every day | ORAL | Status: DC
  Administered 2018-09-23: 100 mg via ORAL
  Filled 2018-09-22: qty 2

## 2018-09-22 SURGICAL SUPPLY — 36 items
BAG ZIPLOCK 12X15 (MISCELLANEOUS) IMPLANT
BIT DRILL 4.8X200 CANN (BIT) ×3 IMPLANT
BLADE SAW SGTL 11.0X1.19X90.0M (BLADE) IMPLANT
BNDG GAUZE ELAST 4 BULKY (GAUZE/BANDAGES/DRESSINGS) ×3 IMPLANT
COVER PERINEAL POST (MISCELLANEOUS) ×3 IMPLANT
COVER SURGICAL LIGHT HANDLE (MISCELLANEOUS) ×3 IMPLANT
DERMABOND ADVANCED (GAUZE/BANDAGES/DRESSINGS) ×2
DERMABOND ADVANCED .7 DNX12 (GAUZE/BANDAGES/DRESSINGS) ×1 IMPLANT
DRAPE STERI IOBAN 125X83 (DRAPES) ×3 IMPLANT
DRAPE U-SHAPE 47X51 STRL (DRAPES) ×6 IMPLANT
DRSG AQUACEL AG ADV 3.5X 4 (GAUZE/BANDAGES/DRESSINGS) ×3 IMPLANT
DRSG AQUACEL AG ADV 3.5X 6 (GAUZE/BANDAGES/DRESSINGS) ×3 IMPLANT
DURAPREP 26ML APPLICATOR (WOUND CARE) ×3 IMPLANT
ELECT REM PT RETURN 15FT ADLT (MISCELLANEOUS) ×3 IMPLANT
GLOVE BIOGEL M 7.0 STRL (GLOVE) IMPLANT
GLOVE BIOGEL PI IND STRL 7.5 (GLOVE) ×1 IMPLANT
GLOVE BIOGEL PI IND STRL 8.5 (GLOVE) IMPLANT
GLOVE BIOGEL PI INDICATOR 7.5 (GLOVE) ×2
GLOVE BIOGEL PI INDICATOR 8.5 (GLOVE)
GLOVE ECLIPSE 8.0 STRL XLNG CF (GLOVE) IMPLANT
GLOVE ORTHO TXT STRL SZ7.5 (GLOVE) ×3 IMPLANT
GOWN STRL REUS W/TWL LRG LVL3 (GOWN DISPOSABLE) ×3 IMPLANT
GOWN STRL REUS W/TWL XL LVL3 (GOWN DISPOSABLE) IMPLANT
KIT BASIN OR (CUSTOM PROCEDURE TRAY) ×3 IMPLANT
MANIFOLD NEPTUNE II (INSTRUMENTS) ×3 IMPLANT
NS IRRIG 1000ML POUR BTL (IV SOLUTION) ×3 IMPLANT
PACK GENERAL/GYN (CUSTOM PROCEDURE TRAY) ×3 IMPLANT
PIN GUIDE DRILL TIP 2.8X300 (DRILL) ×9 IMPLANT
POSITIONER SURGICAL ARM (MISCELLANEOUS) ×3 IMPLANT
SCREW CANN 8.0X95 HIP (Screw) ×6 IMPLANT
SCREW CANNULATED 8.0X110 HIP (Screw) ×3 IMPLANT
SUT MNCRL AB 4-0 PS2 18 (SUTURE) ×3 IMPLANT
SUT VIC AB 1 CT1 36 (SUTURE) ×3 IMPLANT
SUT VIC AB 2-0 CT1 27 (SUTURE) ×2
SUT VIC AB 2-0 CT1 TAPERPNT 27 (SUTURE) ×1 IMPLANT
TOWEL OR 17X26 10 PK STRL BLUE (TOWEL DISPOSABLE) ×3 IMPLANT

## 2018-09-22 NOTE — Anesthesia Procedure Notes (Signed)
Spinal  Patient location during procedure: OR Start time: 09/22/2018 3:01 PM End time: 09/22/2018 3:11 PM Staffing Anesthesiologist: Myrtie Soman, MD Performed: anesthesiologist  Preanesthetic Checklist Completed: patient identified, site marked, surgical consent, pre-op evaluation, timeout performed, IV checked, risks and benefits discussed and monitors and equipment checked Spinal Block Patient position: sitting Prep: Betadine Patient monitoring: heart rate, continuous pulse ox and blood pressure Location: L3-4 Injection technique: single-shot Needle Needle type: Sprotte  Needle gauge: 24 G Needle length: 9 cm Additional Notes Expiration date of kit checked and confirmed. Patient tolerated procedure well, without complications.

## 2018-09-22 NOTE — Anesthesia Postprocedure Evaluation (Signed)
Anesthesia Post Note  Patient: Joshua Carney  Procedure(s) Performed: RIGHT HIP CLOSED REDUCITON WITH PERCUATANEOUS SCREW FIXATION (Right Hip)     Patient location during evaluation: PACU Anesthesia Type: Spinal Level of consciousness: oriented and awake and alert Pain management: pain level controlled Vital Signs Assessment: post-procedure vital signs reviewed and stable Respiratory status: spontaneous breathing, respiratory function stable and patient connected to nasal cannula oxygen Cardiovascular status: blood pressure returned to baseline and stable Postop Assessment: no headache, no backache and no apparent nausea or vomiting Anesthetic complications: no    Last Vitals:  Vitals:   09/22/18 1615 09/22/18 1630  BP: 119/72 (!) 146/67  Pulse: (!) 58 (!) 57  Resp: 15 17  Temp: (!) 36.3 C   SpO2: 100% 100%    Last Pain:  Vitals:   09/22/18 1615  TempSrc:   PainSc: 0-No pain                 Apolonia Ellwood S

## 2018-09-22 NOTE — Brief Op Note (Signed)
09/22/2018  4:34 PM  PATIENT:  Randye Lobo  84 y.o. male  PRE-OPERATIVE DIAGNOSIS:  Right hip impacted subcapital femoral neck fracture  POST-OPERATIVE DIAGNOSIS:  Right hip impacted subcapital femoral neck fracture  PROCEDURE:  Procedure(s) with comments: RIGHT HIP CLOSED REDUCITON WITH PERCUATANEOUS SCREW FIXATION (Right) - 90 mins  SURGEON:  Surgeon(s) and Role:    Paralee Cancel, MD - Primary  PHYSICIAN ASSISTANT: None  ANESTHESIA:   general  EBL:  minimal   BLOOD ADMINISTERED:none  DRAINS: none   LOCAL MEDICATIONS USED:  NONE  SPECIMEN:  No Specimen  DISPOSITION OF SPECIMEN:  N/A  COUNTS:  YES  TOURNIQUET:  * No tourniquets in log *  DICTATION: .Other Dictation: Dictation Number [id number]  PLAN OF CARE: Admit for overnight observation  PATIENT DISPOSITION:  PACU - hemodynamically stable.   Delay start of Pharmacological VTE agent (>24hrs) due to surgical blood loss or risk of bleeding: no

## 2018-09-22 NOTE — Anesthesia Procedure Notes (Signed)
Procedure Name: MAC Date/Time: 09/22/2018 3:17 PM Performed by: Dione Booze, CRNA Pre-anesthesia Checklist: Patient identified, Emergency Drugs available, Suction available and Patient being monitored Patient Re-evaluated:Patient Re-evaluated prior to induction Oxygen Delivery Method: Simple face mask Placement Confirmation: positive ETCO2

## 2018-09-22 NOTE — Anesthesia Preprocedure Evaluation (Signed)
Anesthesia Evaluation  Patient identified by MRN, date of birth, ID band Patient awake    Reviewed: Allergy & Precautions, NPO status , Patient's Chart, lab work & pertinent test results  Airway Mallampati: II  TM Distance: >3 FB Neck ROM: Full    Dental no notable dental hx.    Pulmonary neg pulmonary ROS, former smoker,    Pulmonary exam normal breath sounds clear to auscultation       Cardiovascular hypertension, Normal cardiovascular exam+ dysrhythmias Atrial Fibrillation  Rhythm:Regular Rate:Normal     Neuro/Psych negative neurological ROS  negative psych ROS   GI/Hepatic negative GI ROS, Neg liver ROS,   Endo/Other  Hypothyroidism   Renal/GU negative Renal ROS  negative genitourinary   Musculoskeletal negative musculoskeletal ROS (+)   Abdominal   Peds negative pediatric ROS (+)  Hematology negative hematology ROS (+)   Anesthesia Other Findings   Reproductive/Obstetrics negative OB ROS                             Anesthesia Physical Anesthesia Plan  ASA: III  Anesthesia Plan: Spinal   Post-op Pain Management:    Induction: Intravenous  PONV Risk Score and Plan: 0  Airway Management Planned: Simple Face Mask  Additional Equipment:   Intra-op Plan:   Post-operative Plan:   Informed Consent: I have reviewed the patients History and Physical, chart, labs and discussed the procedure including the risks, benefits and alternatives for the proposed anesthesia with the patient or authorized representative who has indicated his/her understanding and acceptance.   Dental advisory given  Plan Discussed with: CRNA and Surgeon  Anesthesia Plan Comments:         Anesthesia Quick Evaluation

## 2018-09-22 NOTE — Op Note (Signed)
NAME: Joshua Carney, Joshua Carney MEDICAL RECORD [id number] ACCOUNT [id number] DATE OF BIRTH:May 01, 1934 FACILITY: [facility] LOCATION: [facility] PHYSICIAN:Toribio Seiber Marian Sorrow, MD  OPERATIVE REPORT  DATE OF PROCEDURE:  09/22/2018  PREOPERATIVE DIAGNOSIS:  Nondisplaced impacted right subcapital femoral neck fracture.  POSTOPERATIVE DIAGNOSIS:  Nondisplaced impacted right subcapital femoral neck fracture.  PROCEDURE:  Closed reduction percutaneous cannulated screw fixation utilizing Biomet, 8.0 mm cannulated screws.  SURGEON:  Paralee Cancel, MD  ASSISTANT:  Surgical team.  ANESTHESIA:  General.  SPECIMENS:  None.  COMPLICATIONS:  None.  ESTIMATED BLOOD LOSS:  Minimal.  INDICATIONS FOR PROCEDURE:  The patient is a very pleasant 84 year old male who presented to the office after a fall.  He was initially seen by my partner, Vickki Hearing.  At that time, radiographs indicated an irregularity in the subcapital region.  This  was confirmed by CT scan.  He was instructed to limit weightbearing; however, had been weightbearing on this.  He did complain of groin pain.  His left hip otherwise, was normal.  At this point, risks and benefits and necessity of surgical intervention  to prevent further displacement allow for healing of the fracture.  We discussed risks of malunion, nonunion, need for future surgeries were all discussed and reviewed.  In addition, the limited concerns for infection or DVT.  Consent was obtained for  fracture management healing and pain relief.  DESCRIPTION OF PROCEDURE:  The patient was brought to the operative theater.  Once adequate anesthesia, preoperative antibiotics, Ancef administered he was positioned supine on the fracture table.  Once safely and adequately padding of bony prominences,  his left leg was flexed and abducted out of the way with bony prominences padded over the lateral peroneal nerve.  His right leg was placed in the boot and gentle traction applied and the  fracture was stable.  Once this was carried out, fluoroscopy was  utilized to confirm maintenance of reduction of the fracture in both AP and lateral planes.  We indicated a valgus impacted fracture with neutral alignment on the lateral view.  Given these findings, the right hip was prepped and draped in sterile  fashion.  Once this was done, a timeout was performed identifying the patient, the planned procedure and extremity.  Fluoroscopy was brought back to the field.  Orientation lines were made in AP and lateral planes.  An incision was then made at the level  of the lesser trochanter laterally.  The first guidewire was inserted into the inferior neck in the central portion of the femoral neck on the lateral view.  Two other pins were placed proximal, one posterior and one anterior.  Once these were  appropriately positioned for an adequate spread for fracture management, I measured the depth and drilled the outer cortex.  The 3 screws were placed in the order of inferior then posterior and then anterior.  Each was placed and then tightened by hand  with good compression.  Once this was done, final radiographs were obtained in AP and lateral planes.  The pins were removed.  Wound was irrigated with normal saline solution.  I closed it in layers with #1 Vicryl on the iliotibial band, 2-0 Vicryl and a running Monocryl  stitch.  The hip was then cleaned, dried and dressed sterilely using surgical glue and Aquacel dressing.  He was then brought to the recovery room in stable condition, tolerating the procedure well.  Findings were reviewed with family.  We will have him  be partial weightbearing for at least the  first 2 weeks until followup and see how he progresses.  He then may be progressed to weightbearing as tolerated following radiographic assessment.  TN/NUANCE  D:09/22/2018 T:09/22/2018 JOB:[id number]

## 2018-09-22 NOTE — Plan of Care (Signed)
Plan of care 

## 2018-09-22 NOTE — Interval H&P Note (Signed)
History and Physical Interval Note:  09/22/2018 1:05 PM  Joshua Carney  has presented today for surgery, with the diagnosis of Right hip impacted subcapital/ high femoral neck fracture  The various methods of treatment have been discussed with the patient and family. After consideration of risks, benefits and other options for treatment, the patient has consented to  Procedure(s) with comments: RIGHT HIP CLOSED REDUCITON WITH PERCUATANEOUS SCREW FIXATION (Right) - 90 mins as a surgical intervention .  The patient's history has been reviewed, patient examined, no change in status, stable for surgery.  I have reviewed the patient's chart and labs.  Questions were answered to the patient's satisfaction.     [name]

## 2018-09-22 NOTE — Transfer of Care (Signed)
Immediate Anesthesia Transfer of Care Note  Patient: Joshua Carney  Procedure(s) Performed: RIGHT HIP CLOSED REDUCITON WITH PERCUATANEOUS SCREW FIXATION (Right Hip)  Patient Location: PACU  Anesthesia Type:MAC and Spinal  Level of Consciousness: awake, alert  and patient cooperative  Airway & Oxygen Therapy: Patient Spontanous Breathing and Patient connected to face mask oxygen  Post-op Assessment: Report given to RN and Post -op Vital signs reviewed and stable  Post vital signs: Reviewed and stable  Last Vitals:  Vitals Value Taken Time  BP    Temp    Pulse    Resp    SpO2      Last Pain:  Vitals:   09/22/18 1229  TempSrc: Oral  PainSc: 0-No pain      Patients Stated Pain Goal: 4 (67/61/95 0932)  Complications: No apparent anesthesia complications

## 2018-09-23 ENCOUNTER — Encounter (HOSPITAL_COMMUNITY): Payer: Self-pay | Admitting: Orthopedic Surgery

## 2018-09-23 DIAGNOSIS — E663 Overweight: Secondary | ICD-10-CM | POA: Diagnosis present

## 2018-09-23 LAB — BASIC METABOLIC PANEL
Anion gap: 6 (ref 5–15)
BUN: 20 mg/dL (ref 8–23)
CALCIUM: 8.7 mg/dL — AB (ref 8.9–10.3)
CO2: 26 mmol/L (ref 22–32)
CREATININE: 0.62 mg/dL (ref 0.61–1.24)
Chloride: 102 mmol/L (ref 98–111)
GFR calc non Af Amer: >60 mL/min (ref >60)
GLUCOSE: 138 mg/dL — AB (ref 70–99)
Potassium: 4.2 mmol/L (ref 3.5–5.1)
Sodium: 134 mmol/L — ABNORMAL LOW (ref 135–145)

## 2018-09-23 LAB — CBC
HCT: 39.3 % (ref 39.0–52.0)
Hemoglobin: 13.7 g/dL (ref 13.0–17.0)
MCH: 31.9 pg (ref 26.0–34.0)
MCHC: 34.9 g/dL (ref 30.0–36.0)
MCV: 91.4 fL (ref 78.0–100.0)
Platelets: 245 10*3/uL (ref 150–400)
RBC: 4.3 MIL/uL (ref 4.22–5.81)
RDW: 12.9 % (ref 11.5–15.5)
WBC: 8.4 10*3/uL (ref 4.0–10.5)

## 2018-09-23 NOTE — Progress Notes (Signed)
     Subjective: 1 Day Post-Op Procedure(s) (LRB): RIGHT HIP CLOSED REDUCITON WITH PERCUATANEOUS SCREW FIXATION (Right)   Patient reports pain as mild, pain controlled. No events throughout the night.  WB restriction discussed.  Ready to be discharged home.   Objective:   VITALS:   Vitals:   09/23/18 0200 09/23/18 0519  BP: 131/65 (!) 158/75  Pulse: 69 70  Resp: 19 17  Temp: 98.2 F (36.8 C) (!) 97.5 F (36.4 C)  SpO2: 97% 95%    Dorsiflexion/Plantar flexion intact Incision: dressing C/D/I No cellulitis present Compartment soft  LABS Recent Labs    09/22/18 1300 09/23/18 0501  HGB 15.2 13.7  HCT 43.4 39.3  WBC 7.8 8.4  PLT 247 245    Recent Labs    09/22/18 1300 09/23/18 0501  NA 137 134*  K 4.0 4.2  BUN 18 20  CREATININE 0.51* 0.62  GLUCOSE 112* 138*     Assessment/Plan: 1 Day Post-Op Procedure(s) (LRB): RIGHT HIP CLOSED REDUCITON WITH PERCUATANEOUS SCREW FIXATION (Right) Foley cath d/c'ed Advance diet Up with therapy D/C IV fluids Discharge home Follow up in 2 weeks at [facility] Follow up with OLIN,English Tomer D in 2 weeks.  Contact information:  [facility]) [address] [place] [phone]    Overweight (BMI 25-29.9) Estimated body mass index is 26.37 kg/m as calculated from the following:   Height as of this encounter: 5\' 9"  (1.753 m).   Weight as of this encounter: 81 kg. Patient also counseled that weight may inhibit the healing process Patient counseled that losing weight will help with future health issues         [place] Pugh. Rykar Lebleu   PAC  09/23/2018, 8:51 AM

## 2018-09-23 NOTE — Evaluation (Signed)
Physical Therapy Evaluation Patient Details Name: Joshua Carney MRN: [id number] DOB: 1934/11/27 Today's Date: 09/23/2018   History of Present Illness  Pt s/p nondisplaced R hip fx with cannulated screw fixation  Clinical Impression  Pt admitted as above and presenting with functional mobility limitations 2* decreased R LE strength/ROM, post op pain and PWB on R LE.  Pt should progress to dc home with family assist.  Pt and spouse requesting follow up HHPT.    Follow Up Recommendations Home health PT;Follow surgeon's recommendation for DC plan and follow-up therapies    Equipment Recommendations  Rolling walker with 5" wheels    Recommendations for Other Services OT consult     Precautions / Restrictions Precautions Precautions: Fall Restrictions Weight Bearing Restrictions: Yes RLE Weight Bearing: Partial weight bearing      Mobility  Bed Mobility Overal bed mobility: Needs Assistance Bed Mobility: Supine to Sit     Supine to sit: Min guard     General bed mobility comments: cues for sequence and use of L LE to self assist  Transfers Overall transfer level: Needs assistance Equipment used: Rolling walker (2 wheeled) Transfers: Sit to/from Stand Sit to Stand: Min guard         General transfer comment: cues for LE management and use of UEs to self assist  Ambulation/Gait Ambulation/Gait assistance: Min assist;Min guard Gait Distance (Feet): 70 Feet Assistive device: Rolling walker (2 wheeled) Gait Pattern/deviations: Step-to pattern;Decreased step length - right;Decreased step length - left;Shuffle;Trunk flexed Gait velocity: decr   General Gait Details: cues for sequence, posture, position from RW and PWB  Stairs            Wheelchair Mobility    Modified Rankin (Stroke Patients Only)       Balance Overall balance assessment: Mild deficits observed, not formally tested                                           Pertinent  Vitals/Pain Pain Assessment: 0-10 Pain Score: 2  Pain Location: R hip Pain Descriptors / Indicators: Aching;Sore Pain Intervention(s): Limited activity within patient's tolerance;Monitored during session;Premedicated before session    [place] expects to be discharged to:: Private residence Living Arrangements: Spouse/significant other Available Help at Discharge: Family Type of Home: House Home Access: Stairs to enter   Technical brewer of Steps: 1 Home Layout: Two level [place]: [place] - single point;Walker - 4 wheels;Crutches      Prior Function Level of Independence: Independent with assistive device(s);Independent         Comments: using crutches since fall     Hand Dominance        Extremity/Trunk Assessment   Upper Extremity Assessment Upper Extremity Assessment: Overall WFL for tasks assessed    Lower Extremity Assessment Lower Extremity Assessment: RLE deficits/detail RLE Deficits / Details: 3-/5 strength at hip with AAROM at hip to 90 flex and 25 abd    Cervical / Trunk Assessment Cervical / Trunk Assessment: Normal  Communication   Communication: No difficulties  Cognition Arousal/Alertness: Awake/alert Behavior During Therapy: WFL for tasks assessed/performed Overall Cognitive Status: Within Functional Limits for tasks assessed                                        General  Comments      Exercises Total Joint Exercises Ankle Circles/Pumps: AROM;Both;15 reps;Supine Quad Sets: AROM;Both;10 reps;Supine Heel Slides: AAROM;Right;20 reps;Supine Hip ABduction/ADduction: AAROM;Right;15 reps;Supine   Assessment/Plan    PT Assessment Patient needs continued PT services  PT Problem List Decreased strength;Decreased range of motion;Decreased activity tolerance;Decreased mobility;Decreased knowledge of use of DME;Pain       PT Treatment Interventions DME instruction;Gait training;Stair training;Functional  mobility training;Therapeutic activities;Therapeutic exercise;Patient/family education    PT Goals (Current goals can be found in the Care Plan section)  Acute Rehab PT Goals Patient Stated Goal: Regain IND PT Goal Formulation: With patient Time For Goal Achievement: 09/30/18 Potential to Achieve Goals: Good    Frequency 7X/week   Barriers to discharge        Co-evaluation               AM-PAC PT "6 Clicks" Daily Activity  Outcome Measure Difficulty turning over in bed (including adjusting bedclothes, sheets and blankets)?: A Lot Difficulty moving from lying on back to sitting on the side of the bed? : A Lot Difficulty sitting down on and standing up from a chair with arms (e.g., wheelchair, bedside commode, etc,.)?: A Lot Help needed moving to and from a bed to chair (including a wheelchair)?: A Little Help needed walking in hospital room?: A Little Help needed climbing 3-5 steps with a railing? : A Little 6 Click Score: 15    End of Session Equipment Utilized During Treatment: Gait belt Activity Tolerance: Patient tolerated treatment well Patient left: in chair;with call bell/phone within reach;with family/visitor present Nurse Communication: Mobility status PT Visit Diagnosis: History of falling (Z91.81);Difficulty in walking, not elsewhere classified (R26.2)    Time: 2426-8341 PT Time Calculation (min) (ACUTE ONLY): 28 min   Charges:   PT Evaluation $PT Eval Low Complexity: 1 Low PT Treatments $Therapeutic Exercise: 8-22 mins        [name] PT Acute Rehabilitation Services Pager [phone] Office [phone]   Halo Shevlin 09/23/2018, 1:10 PM

## 2018-09-23 NOTE — Progress Notes (Signed)
Physical Therapy Treatment Patient Details Name: Joshua Carney MRN: [id number] DOB: Mar 31, 1934 Today's Date: 09/23/2018    History of Present Illness Pt s/p nondisplaced R hip fx with cannulated screw fixation    PT Comments    Pt moving well but needing frequent cues to maintain PWB.  Spouse present and reviewed stairs with written instruction provided.   Follow Up Recommendations  Home health PT;Follow surgeon's recommendation for DC plan and follow-up therapies     Equipment Recommendations  Rolling walker with 5" wheels    Recommendations for Other Services OT consult     Precautions / Restrictions Precautions Precautions: Fall Restrictions Weight Bearing Restrictions: Yes RLE Weight Bearing: Partial weight bearing    Mobility  Bed Mobility Overal bed mobility: Needs Assistance Bed Mobility: Supine to Sit     Supine to sit: Min guard     General bed mobility comments: Pt up in chair and requests back to same  Transfers Overall transfer level: Needs assistance Equipment used: Rolling walker (2 wheeled) Transfers: Sit to/from Stand Sit to Stand: Min guard         General transfer comment: cues for LE management and use of UEs to self assist  Ambulation/Gait Ambulation/Gait assistance: Min guard Gait Distance (Feet): 85 Feet Assistive device: Rolling walker (2 wheeled) Gait Pattern/deviations: Step-to pattern;Decreased step length - right;Decreased step length - left;Shuffle;Trunk flexed Gait velocity: decr   General Gait Details: cues for sequence, posture, position from RW and PWB   Stairs Stairs: Yes Stairs assistance: Min assist Stair Management: One rail Right;Step to pattern;Forwards;With crutches Number of Stairs: 7 General stair comments: cues for sequence, posture and position from RW.  Spouse assisting and written instruction provided   Wheelchair Mobility    Modified Rankin (Stroke Patients Only)       Balance Overall balance  assessment: Mild deficits observed, not formally tested                                          Cognition Arousal/Alertness: Awake/alert Behavior During Therapy: WFL for tasks assessed/performed Overall Cognitive Status: Within Functional Limits for tasks assessed                                        Exercises Total Joint Exercises Ankle Circles/Pumps: AROM;Both;15 reps;Supine Quad Sets: AROM;Both;10 reps;Supine Heel Slides: AAROM;Right;20 reps;Supine Hip ABduction/ADduction: AAROM;Right;15 reps;Supine    General Comments        Pertinent Vitals/Pain Pain Assessment: 0-10 Pain Score: 2  Pain Location: R hip Pain Descriptors / Indicators: Aching;Sore Pain Intervention(s): Limited activity within patient's tolerance;Monitored during session;Premedicated before session    [place] expects to be discharged to:: Private residence Living Arrangements: Spouse/significant other Available Help at Discharge: Family Type of Home: House Home Access: Stairs to enter   Home Layout: Two level Home Equipment: [place] - single point;Walker - 4 wheels;Crutches      Prior Function Level of Independence: Independent with assistive device(s);Independent      Comments: using crutches since fall   PT Goals (current goals can now be found in the care plan section) Acute Rehab PT Goals Patient Stated Goal: Regain IND PT Goal Formulation: With patient Time For Goal Achievement: 09/30/18 Potential to Achieve Goals: Good Progress towards PT goals: Progressing toward goals  Frequency    7X/week      PT Plan Current plan remains appropriate    Co-evaluation              AM-PAC PT "6 Clicks" Daily Activity  Outcome Measure  Difficulty turning over in bed (including adjusting bedclothes, sheets and blankets)?: A Lot Difficulty moving from lying on back to sitting on the side of the bed? : A Lot Difficulty sitting down on  and standing up from a chair with arms (e.g., wheelchair, bedside commode, etc,.)?: A Lot Help needed moving to and from a bed to chair (including a wheelchair)?: A Little Help needed walking in hospital room?: A Little Help needed climbing 3-5 steps with a railing? : A Little 6 Click Score: 15    End of Session Equipment Utilized During Treatment: Gait belt Activity Tolerance: Patient tolerated treatment well Patient left: in chair;with call bell/phone within reach;with family/visitor present Nurse Communication: Mobility status PT Visit Diagnosis: History of falling (Z91.81);Difficulty in walking, not elsewhere classified (R26.2)     Time: 7654-6503 PT Time Calculation (min) (ACUTE ONLY): 37 min  Charges:  $Gait Training: 8-22 mins $Therapeutic Exercise: 8-22 mins $Therapeutic Activity: 8-22 mins                     [name] PT Acute Rehabilitation Services Pager [phone] Office [phone]    [name] 09/23/2018, 3:54 PM

## 2018-09-28 NOTE — Discharge Summary (Signed)
Physician Discharge Summary  Patient ID: Joshua Carney MRN: [id number] DOB/AGE: 04/27/1934 84 y.o.  Admit date: 09/22/2018 Discharge date: 09/23/2018   Procedures:  Procedure(s) (LRB): RIGHT HIP CLOSED REDUCITON WITH PERCUATANEOUS SCREW FIXATION (Right)  Attending Physician:  Dr. Paralee Cancel   Admission Diagnoses:   Right impacted subcapital/high femoral neck fracture  Discharge Diagnoses:  Active Problems:   Closed right hip fracture (HCC)   Overweight (BMI 25.0-29.9)  Past Medical History:  Diagnosis Date  . A-fib ([place])   . BPH (benign prostatic hyperplasia)   . Cancer (HCC)    hx of skin cancer   . Degenerative disc disease, cervical   . Hearing loss   . History of kidney stones   . Hypertension   . Hypothyroidism   . Insomnia   . Internal hemorrhoids   . Left bundle branch block   . Left cervical radiculopathy   . Mixed hyperlipidemia   . Osteoarthritis   . Peripheral neuropathy     HPI:    Pt is a 84 y.o. male complaining of right hip pain for days.  He comes in today for an evaluation after an acute injury. He essentially fell when he was going down the steps and missed the last one. He landed more on the right side of his body. He began having pain on the outside and buttock region about his right hip. He does have pain with ambulation. He had been walking with a cane, but is now in a wheelchair.  CT scan was obtained which revealed a impacted subcapital/high femoral neck fracture.  Dr. Alvan Dame had a discussion with the patient and he wishes to proceed with surgery.Risks, benefits and expectations were discussed with the patient.  Risks including but not limited to the risk of anesthesia, blood clots, nerve damage, blood vessel damage, failure of the prosthesis, infection and up to and including death.  Patient understand the risks, benefits and expectations and wishes to proceed with surgery.   PCP: Derinda Late, MD   Discharged Condition: good  Hospital  Course:  Patient underwent the above stated procedure on 09/22/2018. Patient tolerated the procedure well and brought to the recovery room in good condition and subsequently to the floor.  POD #1 BP: 158/75 ; Pulse: 70 ; Temp: 97.5 F (36.4 C) ; Resp: 17 Patient reports pain as mild, pain controlled. No events throughout the night.  WB restriction discussed.  Ready to be discharged home. Dorsiflexion/plantar flexion intact, incision: dressing C/D/I, no cellulitis present and compartment soft.   LABS  Basename    HGB     13.7  HCT     39.3    Discharge Exam: General appearance: alert, cooperative and no distress Extremities: Homans sign is negative, no sign of DVT, no edema, redness or tenderness in the calves or thighs and no ulcers, gangrene or trophic changes  Disposition:  Home with follow up in 2 weeks   Follow-up Information    Paralee Cancel, MD. Schedule an appointment as soon as possible for a visit in 2 weeks.   Specialty:  Orthopedic Surgery Contact information: [address] [place] [address] [phone]           Discharge Instructions    Call MD / Call 911   Complete by:  As directed    If you experience chest pain or shortness of breath, CALL 911 and be transported to the hospital emergency room.  If you develope a fever above 101 F,  pus (white drainage) or increased drainage or redness at the wound, or calf pain, call your surgeon's office.   Constipation Prevention   Complete by:  As directed    Drink plenty of fluids.  Prune juice may be helpful.  You may use a stool softener, such as Colace (over the counter) 100 mg twice a day.  Use MiraLax (over the counter) for constipation as needed.   Diet - low sodium heart healthy   Complete by:  As directed    Discharge instructions   Complete by:  As directed    Maintain surgical dressing until follow up in the clinic. If the edges start to pull up, may reinforce with tape. If the dressing is  no longer working, may remove and cover with gauze and tape, but must keep the area dry and clean.  Follow up in 2 weeks at [facility]. Call with any questions or concerns.   Partial weight bearing   Complete by:  As directed    % Body Weight:  50   Laterality:  right   Extremity:  Lower      Allergies as of 09/23/2018   No Known Allergies     Medication List    STOP taking these medications   acetaminophen 500 MG tablet Commonly known as:  TYLENOL   MAGNESIUM PO     TAKE these medications   ALPRAZolam 0.5 MG tablet Commonly known as:  XANAX Take 0.5 mg by mouth at bedtime as needed for sleep.   amLODipine 5 MG tablet Commonly known as:  NORVASC Take 5 mg by mouth daily.   B COMPLEX 100 PO Take 100 mg by mouth daily.   docusate sodium 100 MG capsule Commonly known as:  COLACE Take 1 capsule (100 mg total) by mouth 2 (two) times daily.   ELIQUIS 5 MG Tabs tablet Generic drug:  apixaban Take 1 tablet (5 mg total) by mouth 2 (two) times daily.   EQL COQ10 300 MG Caps Generic drug:  Coenzyme Q10 Take 300 mg by mouth daily.   Evening Primrose Oil 1000 MG Caps Take 1,000 mg by mouth daily.   ferrous sulfate 325 (65 FE) MG tablet Take 1 tablet (325 mg total) by mouth 3 (three) times daily with meals.   HYDROcodone-acetaminophen 7.5-325 MG tablet Commonly known as:  NORCO Take 1-2 tablets by mouth every 4 (four) hours as needed for moderate pain.   hydroxypropyl methylcellulose / hypromellose 2.5 % ophthalmic solution Commonly known as:  ISOPTO TEARS / GONIOVISC Place 1 drop into both eyes daily as needed for dry eyes.   levothyroxine 75 MCG tablet Commonly known as:  SYNTHROID, LEVOTHROID Take 37.5 mcg by mouth See admin instructions. Take 37.5 mcg by mouth daily in the evening except do NOT take on Monday.   losartan 100 MG tablet Commonly known as:  COZAAR Take 100 mg by mouth daily.   Melatonin 5 MG Tabs Take 5 mg by mouth at bedtime.     methocarbamol 500 MG tablet Commonly known as:  ROBAXIN Take 1 tablet (500 mg total) by mouth every 6 (six) hours as needed for muscle spasms.   metoprolol succinate 50 MG 24 hr tablet Commonly known as:  TOPROL-XL Take 1 tablet (50 mg total) by mouth daily. Take with or immediately following a meal.   nitroGLYCERIN 0.4 MG SL tablet Commonly known as:  NITROSTAT Place 1 tablet (0.4 mg total) under the tongue every 5 (five) minutes as needed for chest  pain.   polyethylene glycol packet Commonly known as:  MIRALAX / GLYCOLAX Take 17 g by mouth 2 (two) times daily.   PRESERVISION AREDS 2 PO Take 1 capsule by mouth 2 (two) times daily.   Vitamin D3 5000 units Caps Take 5,000 Units by mouth daily.            Discharge Care Instructions  (From admission, onward)         Start     Ordered   09/23/18 0000  Partial weight bearing    Question Answer Comment  % Body Weight 50   Laterality right   Extremity Lower      09/23/18 0855           Signed: West Pugh. Maricus Tanzi   PA-C  09/28/2018, 11:26 PM

## 2018-11-02 ENCOUNTER — Other Ambulatory Visit: Payer: Self-pay | Admitting: Cardiology

## 2018-11-23 ENCOUNTER — Telehealth: Payer: Self-pay | Admitting: *Deleted

## 2018-11-23 NOTE — Telephone Encounter (Signed)
Pt takes Eliquis for afib with CHADS2VASc score of (age x2, CHF, HTN, CAD). Renal function is normal. Ok to hold Eliquis for 3 days prior to procedure per protocol.

## 2018-11-23 NOTE — Telephone Encounter (Signed)
    [facility] Pre-operative Risk Assessment    Request for surgical clearance:  1. What type of surgery is being performed?  CONVERSION TO RIGHT TOTAL HIP  2. When is this surgery scheduled?  12/24/18   3. What type of clearance is required (medical clearance vs. Pharmacy clearance to hold med vs. Both)? BOTH  4. Are there any medications that need to be held prior to surgery and how long? ELIQUIS  5. Practice name and name of physician performing surgery?  [facility]   6. What is your office phone number [phone]   7.   What is your office fax number [phone]  8.   Anesthesia type (None, local, MAC, general) ? SPINAL   Joshua Carney 11/23/2018, 1:55 PM  _________________________________________________________________   (provider comments below)

## 2018-11-23 NOTE — Telephone Encounter (Signed)
   Primary Cardiologist:James Hochrein, MD  Chart reviewed as part of pre-operative protocol coverage.   84 yo male with hx of CAD, Cardiomyopathy, AFib, HTN, HLP, carotid dz.   EF in 2013 was 35-40% LHC in 1/14 demonstrated 95% in a small AV circumflex and mild to mod nonobstructive disease elsewhere.  Med Rx was recommended.  A follow up nuclear stress test in 4/14 was normal. Echo in 5/18:  EF 50-55. Carotid US in 6/19: bilat ICA 1-39% Last seen by Dr. Percival Spanish 7/19. CHADS2-VASc=5 (HTN, Vascular Dz, age x 2, CHF).   Anticoagulation:  Eliquis 5 mg bid   RCRI:  0.9  PLAN:  1. I will forward to [facility] for anticoagulation recommendations first. 2. Patient will then need a phone call to assess for any clinical change since last office visit.  Richardson Dopp, PA-C  11/23/2018, 3:58 PM

## 2018-11-24 NOTE — Telephone Encounter (Signed)
   Call back staff: Marland Kitchen Clearance letter has been faxed to the requesting surgeon. . Please contact the surgeon's office to ensure it has been received. . This phone note will be removed from the preop pool. . Please sign encounter when completed.  Richardson Dopp, PA-C    11/24/2018 4:49 PM

## 2018-11-24 NOTE — Telephone Encounter (Signed)
   Primary Cardiologist: [name], MD  Chart reviewed as part of pre-operative protocol coverage.   Called patient today.  He was quite active before he broke his hip 5 weeks ago.  He could achieve well over 4 METs without chest pain or shortness of breath.   RCRI:  0.9%.  He may proceed with surgery at acceptable risk. He can hold Eliquis for 3 days as noted (last day 12/20/18) and resume post op when safe. Letter sent to surgeon.  Richardson Dopp, PA-C 11/24/2018, 4:40 PM

## 2018-11-25 NOTE — Telephone Encounter (Signed)
CALLED [facility] SCHEDULER FOR DR Alvan Dame WHO IS OUT THE OFFICE UNTIL DEC 2 AND WOULD LIKE TO BE CONTACTED BACK THEN. STATED TO NOT LEAVE VOICE MESSAGES ON THE MACHINE THEY WILL NOT BE RETURNED.  CLEARANCE FAXED [phone]

## 2018-12-01 NOTE — Telephone Encounter (Signed)
Patient dropped of clearance request from Dr. Aurea Graff office. This has been addressed already. He is requesting that this be sent to PCP as well. E-faxed clearance note to Dr. Alvan Dame and PCP

## 2018-12-07 NOTE — H&P (Signed)
TOTAL HIP CONVERSION ADMISSION H&P  Patient is admitted for right hip conversion from screws to THA.  Subjective:  Chief Complaint: Right hip failed previous surgery  HPI: Joshua Carney, 84 y.o. male, has a history of pain and functional disability in the right hip due to failure of previous percutaneous screw fixation and patient has failed non-surgical conservative treatments to include NSAID's and/or analgesics, use of assistive devices and activity modification. The indications for the revision total hip arthroplasty are failure of previous hip surgery.  Onset of symptoms was gradual starting 3 months ago with rapidlly worsening course since that time.  Prior procedures on the right hip include percutaneous screw fixation.  Patient currently rates pain in the right hip at 8 out of 10 with activity.  There is worsening of pain with activity and weight bearing, trendelenberg gait, pain that interfers with activities of daily living and pain with passive range of motion. Patient has evidence of collapse of the femoral neck around the percutaneous screws by imaging studies.  This condition presents safety issues increasing the risk of falls.    There is no current active infection. Risks, benefits and expectations were discussed with the patient.  Risks including but not limited to the risk of anesthesia, blood clots, nerve damage, blood vessel damage, failure of the prosthesis, infection and up to and including death.  Patient understand the risks, benefits and expectations and wishes to proceed with surgery.   PCP: Derinda Late, MD  D/C Plans:       Home   Post-op Meds:       No Rx given  Tranexamic Acid:      To be given - IV   Decadron:      Is to be given  FYI:      Eliquis (on pre-op)  Norco  DME:   Pt already has equipment  PT:   No PT    Patient Active Problem List   Diagnosis Date Noted  . Overweight (BMI 25.0-29.9) 09/23/2018  . Closed right hip fracture ([place]) 09/22/2018   . Annual physical exam 05/04/2018  . Dyslipidemia 05/04/2018  . Tachycardia 04/28/2017  . LBBB (left bundle branch block) 04/28/2017  . Chronic systolic CHF (congestive heart failure) ([place]) 04/28/2017  . Blurry vision 04/28/2017  . Atrial fibrillation with RVR ([place]) 04/28/2017  . Hypothyroidism 04/28/2017  . Hypertension   . BPH (benign prostatic hyperplasia)   . Mixed hyperlipidemia   . Nonischemic cardiomyopathy ([place]) 01/27/2013  . Abnormal EKG 12/11/2012   Past Medical History:  Diagnosis Date  . A-fib ([place])   . BPH (benign prostatic hyperplasia)   . Cancer (HCC)    hx of skin cancer   . Degenerative disc disease, cervical   . Hearing loss   . History of kidney stones   . Hypertension   . Hypothyroidism   . Insomnia   . Internal hemorrhoids   . Left bundle branch block   . Left cervical radiculopathy   . Mixed hyperlipidemia   . Osteoarthritis   . Peripheral neuropathy     Past Surgical History:  Procedure Laterality Date  . BACK SURGERY     lumbar  . HAND SURGERY     wrestling injury  . HEMORRHOID SURGERY    . HIP PINNING,CANNULATED Right 09/22/2018   Procedure: RIGHT HIP CLOSED REDUCITON WITH PERCUATANEOUS SCREW FIXATION;  Surgeon: Paralee Cancel, MD;  Location: [facility] ORS;  Service: Orthopedics;  Laterality: Right;  90 mins  . JOINT REPLACEMENT    .  LEFT HEART CATHETERIZATION WITH CORONARY ANGIOGRAM N/A 01/21/2013   Procedure: LEFT HEART CATHETERIZATION WITH CORONARY ANGIOGRAM;  Surgeon: [name], MD;  Location: [facility] CATH LAB;  Service: Cardiovascular;  Laterality: N/A;  . skin cancer removal      head and face   . TONSILLECTOMY AND ADENOIDECTOMY    . TOTAL KNEE ARTHROPLASTY     left    No current facility-administered medications for this encounter.    Current Outpatient Medications  Medication Sig Dispense Refill Last Dose  . ALPRAZolam (XANAX) 0.5 MG tablet Take 0.5 mg by mouth at bedtime as needed for sleep.    09/21/2018 at Unknown time  . amLODipine  (NORVASC) 5 MG tablet Take 5 mg by mouth daily.   09/21/2018 at Unknown time  . B Complex Vitamins (B COMPLEX 100 PO) Take 100 mg by mouth daily.   09/19/2018  . Cholecalciferol (VITAMIN D3) 5000 units CAPS Take 5,000 Units by mouth daily.   09/19/2018  . Coenzyme Q10 (EQL COQ10) 300 MG CAPS Take 300 mg by mouth daily.    09/19/2018  . docusate sodium (COLACE) 100 MG capsule Take 1 capsule (100 mg total) by mouth 2 (two) times daily. 10 capsule 0   . ELIQUIS 5 MG TABS tablet Take 1 tablet (5 mg total) by mouth 2 (two) times daily. 180 tablet 3 09/19/2018  . Evening Primrose Oil 1000 MG CAPS Take 1,000 mg by mouth daily.   09/19/2018  . ferrous sulfate (FERROUSUL) 325 (65 FE) MG tablet Take 1 tablet (325 mg total) by mouth 3 (three) times daily with meals.  3   . HYDROcodone-acetaminophen (NORCO) 7.5-325 MG tablet Take 1-2 tablets by mouth every 4 (four) hours as needed for moderate pain. 60 tablet 0   . hydroxypropyl methylcellulose / hypromellose (ISOPTO TEARS / GONIOVISC) 2.5 % ophthalmic solution Place 1 drop into both eyes daily as needed for dry eyes.    Past Week at Unknown time  . levothyroxine (SYNTHROID, LEVOTHROID) 75 MCG tablet Take 37.5 mcg by mouth See admin instructions. Take 37.5 mcg by mouth daily in the evening except do NOT take on Monday.   09/20/2018  . losartan (COZAAR) 100 MG tablet Take 100 mg by mouth daily.  3 09/22/2018 at 0700  . Melatonin 5 MG TABS Take 5 mg by mouth at bedtime.    09/19/2018  . methocarbamol (ROBAXIN) 500 MG tablet Take 1 tablet (500 mg total) by mouth every 6 (six) hours as needed for muscle spasms. 40 tablet 0   . metoprolol succinate (TOPROL XL) 50 MG 24 hr tablet Take 1 tablet (50 mg total) by mouth daily. Take with or immediately following a meal. 30 tablet 0 09/22/2018 at 0700  . Multiple Vitamins-Minerals (PRESERVISION AREDS 2 PO) Take 1 capsule by mouth 2 (two) times daily.   09/19/2018  . nitroGLYCERIN (NITROSTAT) 0.4 MG SL tablet PLACE 1 TABLET UNDER THE  TONGUE EVERY 5 MINUTES AS NEEDED FOR CHEST PAIN 25 tablet 3   . polyethylene glycol (MIRALAX / GLYCOLAX) packet Take 17 g by mouth 2 (two) times daily. 14 each 0    No Known Allergies   Social History   Tobacco Use  . Smoking status: Former Smoker    Types: Cigarettes  . Smokeless tobacco: Never Used  . Tobacco comment: Light distant smoking history.  Substance Use Topics  . Alcohol use: Yes    Alcohol/week: 1.0 standard drinks    Types: 1 Standard drinks or equivalent per week  Comment: rare     Family History  Problem Relation Age of Onset  . Hypertension Mother   . Hypertension Father   . Gallbladder disease Father   . Atrial fibrillation Sister   . High blood pressure Sister       Review of Systems  Constitutional: Negative.   HENT: Negative.   Eyes: Negative.   Respiratory: Negative.   Cardiovascular: Negative.   Gastrointestinal: Positive for constipation.  Genitourinary: Negative.   Musculoskeletal: Positive for joint pain.  Skin: Negative.   Neurological: Negative.   Endo/Heme/Allergies: Negative.   Psychiatric/Behavioral: Negative.     Objective:  Physical Exam  Constitutional: He is oriented to person, place, and time. He appears well-developed.  HENT:  Head: Normocephalic.  Eyes: Pupils are equal, round, and reactive to light.  Neck: Neck supple. No JVD present. No tracheal deviation present. No thyromegaly present.  Cardiovascular: Normal rate, regular rhythm and intact distal pulses.  Respiratory: Effort normal and breath sounds normal. No respiratory distress. He has no wheezes.  GI: Soft. There is no tenderness. There is no guarding.  Musculoskeletal:       Right hip: He exhibits decreased range of motion, decreased strength, tenderness and bony tenderness. He exhibits no swelling, no deformity and no laceration (healed previous incision).  Lymphadenopathy:    He has no cervical adenopathy.  Neurological: He is alert and oriented to person,  place, and time.  Skin: Skin is warm and dry.  Psychiatric: He has a normal mood and affect.      Labs:  Estimated body mass index is 26.37 kg/m as calculated from the following:   Height as of 09/22/18: 5\' 9"  (1.753 m).   Weight as of 09/22/18: 81 kg.  Imaging Review:  Plain radiographs demonstrate severe degenerative joint disease of the right hip.  The bone quality appears to be good for age and reported activity level. There is evidence of failure of the percutaneous screw fixation.   Preoperative templating of the joint replacement has been completed, documented, and submitted to the Operating Room personnel in order to optimize intra-operative equipment management.   Assessment/Plan:  Right hip with failed previous surgery.  The patient history, physical examination, clinical judgement of the provider and imaging studies are consistent with failure of previous surgery of the right hip. Conversion to total hip arthroplasty is deemed medically necessary.  The risks and benefits of total hip arthroplasty were presented and reviewed. The risks due to aseptic loosening, infection, stiffness, dislocation/subluxation,  thromboembolic complications and other imponderables were discussed.  The patient acknowledged the explanation, agreed to proceed with the plan and consent was signed. Patient is being admitted for inpatient treatment for surgery, pain control, PT, OT, prophylactic antibiotics, VTE prophylaxis, progressive ambulation and ADL's and discharge planning. The patient is planning to be discharged home.     West Pugh Dustina Scoggin   PA-C  12/07/2018, 11:46 AM

## 2018-12-14 NOTE — Patient Instructions (Signed)
Joshua Carney  17-Mar-1934    Your procedure is scheduled on:  12-24-2018   Report to [facility] Main  Entrance, Report to admitting at  5:30 AM    Call this number if you have problems the morning of surgery [phone]       Remember: Do not eat food or drink liquids :After Midnight.                                       BRUSH YOUR TEETH MORNING OF SURGERY AND RINSE YOUR MOUTH OUT, NO CHEWING GUM CANDY OR MINTS.         Take these medicines the morning of surgery with A SIP OF WATER:  Amlodipine, Tamsulosin (flomax), Levothyroxine                                    You may not have any metal on your body including hair pins and               piercings  Do not wear jewelry, make-up, lotions, powders or perfumes, deodorant              Men may shave face and neck.        Do not bring valuables to the hospital. [place].  Contacts, dentures or bridgework may not be worn into surgery.  Leave suitcase in the car. After surgery it may be brought to your room.       Special Instructions: N/A        _____________________________________________________________________             [facility] - Preparing for Surgery Before surgery, you can play an important role.  Because skin is not sterile, your skin needs to be as free of germs as possible.  You can reduce the number of germs on your skin by washing with CHG (chlorahexidine gluconate) soap before surgery.  CHG is an antiseptic cleaner which kills germs and bonds with the skin to continue killing germs even after washing. Please DO NOT use if you have an allergy to CHG or antibacterial soaps.  If your skin becomes reddened/irritated stop using the CHG and inform your nurse when you arrive at Short Stay. Do not shave (including legs and underarms) for at least 48 hours prior to the first CHG shower.  You may shave your face/neck. Please  follow these instructions carefully:  1.  Shower with CHG Soap the night before surgery and the  morning of Surgery.  2.  If you choose to wash your hair, wash your hair first as usual with your  normal  shampoo.  3.  After you shampoo, rinse your hair and body thoroughly to remove the  shampoo.                            4.  Use CHG as you would any other liquid soap.  You can apply chg directly  to the skin and wash  Gently with a scrungie or clean washcloth.  5.  Apply the CHG Soap to your body ONLY FROM THE NECK DOWN.   Do not use on face/ open                           Wound or open sores. Avoid contact with eyes, ears mouth and genitals (private parts).                       Wash face,  Genitals (private parts) with your normal soap.             6.  Wash thoroughly, paying special attention to the area where your surgery  will be performed.  7.  Thoroughly rinse your body with warm water from the neck down.  8.  DO NOT shower/wash with your normal soap after using and rinsing off  the CHG Soap.             9.  Pat yourself dry with a clean towel.            10.  Wear clean pajamas.            11.  Place clean sheets on your bed the night of your first shower and do not  sleep with pets. Day of Surgery : Do not apply any lotions/deodorants the morning of surgery.  Please wear clean clothes to the hospital/surgery center.  FAILURE TO FOLLOW THESE INSTRUCTIONS MAY RESULT IN THE CANCELLATION OF YOUR SURGERY PATIENT SIGNATURE_________________________________  NURSE SIGNATURE__________________________________  ________________________________________________________________________   Joshua Carney  An incentive spirometer is a tool that can help keep your lungs clear and active. This tool measures how well you are filling your lungs with each breath. Taking long deep breaths may help reverse or decrease the chance of developing breathing (pulmonary) problems  (especially infection) following:  A long period of time when you are unable to move or be active. BEFORE THE PROCEDURE   If the spirometer includes an indicator to show your best effort, your nurse or respiratory therapist will set it to a desired goal.  If possible, sit up straight or lean slightly forward. Try not to slouch.  Hold the incentive spirometer in an upright position. INSTRUCTIONS FOR USE  1. Sit on the edge of your bed if possible, or sit up as far as you can in bed or on a chair. 2. Hold the incentive spirometer in an upright position. 3. Breathe out normally. 4. Place the mouthpiece in your mouth and seal your lips tightly around it. 5. Breathe in slowly and as deeply as possible, raising the piston or the ball toward the top of the column. 6. Hold your breath for 3-5 seconds or for as long as possible. Allow the piston or ball to fall to the bottom of the column. 7. Remove the mouthpiece from your mouth and breathe out normally. 8. Rest for a few seconds and repeat Steps 1 through 7 at least 10 times every 1-2 hours when you are awake. Take your time and take a few normal breaths between deep breaths. 9. The spirometer may include an indicator to show your best effort. Use the indicator as a goal to work toward during each repetition. 10. After each set of 10 deep breaths, practice coughing to be sure your lungs are clear. If you have an incision (the cut made at the time of surgery), support your incision  when coughing by placing a pillow or rolled up towels firmly against it. Once you are able to get out of bed, walk around indoors and cough well. You may stop using the incentive spirometer when instructed by your caregiver.  RISKS AND COMPLICATIONS  Take your time so you do not get dizzy or light-headed.  If you are in pain, you may need to take or ask for pain medication before doing incentive spirometry. It is harder to take a deep breath if you are having  pain. AFTER USE  Rest and breathe slowly and easily.  It can be helpful to keep track of a log of your progress. Your caregiver can provide you with a simple table to help with this. If you are using the spirometer at home, follow these instructions: [place] IF:   You are having difficultly using the spirometer.  You have trouble using the spirometer as often as instructed.  Your pain medication is not giving enough relief while using the spirometer.  You develop fever of 100.5 F (38.1 C) or higher. SEEK IMMEDIATE MEDICAL CARE IF:   You cough up bloody sputum that had not been present before.  You develop fever of 102 F (38.9 C) or greater.  You develop worsening pain at or near the incision site. MAKE SURE YOU:   Understand these instructions.  Will watch your condition.  Will get help right away if you are not doing well or get worse. Document Released: 04/28/2007 Document Revised: 03/09/2012 Document Reviewed: 06/29/2007 Exit[employer] Patient Information 2014 Exit[employer], [place].   ________________________________________________________________________  WHAT IS A BLOOD TRANSFUSION? Blood Transfusion Information  A transfusion is the replacement of blood or some of its parts. Blood is made up of multiple cells which provide different functions.  Red blood cells carry oxygen and are used for blood loss replacement.  White blood cells fight against infection.  Platelets control bleeding.  Plasma helps clot blood.  Other blood products are available for specialized needs, such as hemophilia or other clotting disorders. BEFORE THE TRANSFUSION  Who gives blood for transfusions?   Healthy volunteers who are fully evaluated to make sure their blood is safe. This is blood bank blood. Transfusion therapy is the safest it has ever been in the practice of medicine. Before blood is taken from a donor, a complete history is taken to make sure that person has no history  of diseases nor engages in risky social behavior (examples are intravenous drug use or sexual activity with multiple partners). The donor's travel history is screened to minimize risk of transmitting infections, such as malaria. The donated blood is tested for signs of infectious diseases, such as HIV and hepatitis. The blood is then tested to be sure it is compatible with you in order to minimize the chance of a transfusion reaction. If you or a relative donates blood, this is often done in anticipation of surgery and is not appropriate for emergency situations. It takes many days to process the donated blood. RISKS AND COMPLICATIONS Although transfusion therapy is very safe and saves many lives, the main dangers of transfusion include:   Getting an infectious disease.  Developing a transfusion reaction. This is an allergic reaction to something in the blood you were given. Every precaution is taken to prevent this. The decision to have a blood transfusion has been considered carefully by your caregiver before blood is given. Blood is not given unless the benefits outweigh the risks. AFTER THE TRANSFUSION  Right after  receiving a blood transfusion, you will usually feel much better and more energetic. This is especially true if your red blood cells have gotten low (anemic). The transfusion raises the level of the red blood cells which carry oxygen, and this usually causes an energy increase.  The nurse administering the transfusion will monitor you carefully for complications. HOME CARE INSTRUCTIONS  No special instructions are needed after a transfusion. You may find your energy is better. Speak with your caregiver about any limitations on activity for underlying diseases you may have. SEEK MEDICAL CARE IF:   Your condition is not improving after your transfusion.  You develop redness or irritation at the intravenous (IV) site. SEEK IMMEDIATE MEDICAL CARE IF:  Any of the following symptoms  occur over the next 12 hours:  Shaking chills.  You have a temperature by mouth above 102 F (38.9 C), not controlled by medicine.  Chest, back, or muscle pain.  People around you feel you are not acting correctly or are confused.  Shortness of breath or difficulty breathing.  Dizziness and fainting.  You get a rash or develop hives.  You have a decrease in urine output.  Your urine turns a dark color or changes to pink, red, or brown. Any of the following symptoms occur over the next 10 days:  You have a temperature by mouth above 102 F (38.9 C), not controlled by medicine.  Shortness of breath.  Weakness after normal activity.  The white part of the eye turns yellow (jaundice).  You have a decrease in the amount of urine or are urinating less often.  Your urine turns a dark color or changes to pink, red, or brown. Document Released: 12/13/2000 Document Revised: 03/09/2012 Document Reviewed: 08/01/2008 [facility] Patient Information 2014 [place], [place].  _______________________________________________________________________

## 2018-12-17 ENCOUNTER — Encounter (HOSPITAL_COMMUNITY): Payer: Self-pay

## 2018-12-17 ENCOUNTER — Encounter (HOSPITAL_COMMUNITY)
Admission: RE | Admit: 2018-12-17 | Discharge: 2018-12-17 | Disposition: A | Discharge status: Home / Self Care | Payer: Medicare Other | Source: Ambulatory Visit | Attending: Orthopedic Surgery | Admitting: Orthopedic Surgery

## 2018-12-17 ENCOUNTER — Other Ambulatory Visit: Payer: Self-pay

## 2018-12-17 DIAGNOSIS — Z01812 Encounter for preprocedural laboratory examination: Secondary | ICD-10-CM | POA: Insufficient documentation

## 2018-12-17 HISTORY — DX: Chronic systolic (congestive) heart failure: I50.22

## 2018-12-17 HISTORY — DX: Occlusion and stenosis of bilateral carotid arteries: I65.23

## 2018-12-17 HISTORY — DX: Other cardiomyopathies: I42.8

## 2018-12-17 HISTORY — DX: Benign prostatic hyperplasia with lower urinary tract symptoms: N40.1

## 2018-12-17 HISTORY — DX: Paroxysmal atrial fibrillation: I48.0

## 2018-12-17 HISTORY — DX: Long term (current) use of anticoagulants: Z79.01

## 2018-12-17 HISTORY — DX: Personal history of other endocrine, nutritional and metabolic disease: Z86.39

## 2018-12-17 HISTORY — DX: Male erectile dysfunction, unspecified: N52.9

## 2018-12-17 HISTORY — DX: Personal history of other malignant neoplasm of skin: Z85.828

## 2018-12-17 HISTORY — DX: Presence of spectacles and contact lenses: Z97.3

## 2018-12-17 HISTORY — DX: Atherosclerotic heart disease of native coronary artery without angina pectoris: I25.10

## 2018-12-17 LAB — SURGICAL PCR SCREEN
MRSA, PCR: NEGATIVE
STAPHYLOCOCCUS AUREUS: NEGATIVE

## 2018-12-17 LAB — PROTIME-INR
INR: 0.91
Prothrombin Time: 12.2 seconds (ref 11.4–15.2)

## 2018-12-17 NOTE — Progress Notes (Signed)
Please enter pre-op orders in epic. Thank You!

## 2018-12-17 NOTE — Progress Notes (Signed)
Pt pcp surgical clearance, dr Lawerance Sabal, dated 12-09-2018 with chart. Included are lab results dated 12-04-2018 [facility] and CMP).  Cardiac clearance , dr Percival Spanish, dated 11-24-2018 with chart.  Dr Percival Spanish lov note in epic dated 07-17-2018.  EKG dated 05-04-2018 in epic. ECHO dated 04-29-2018 in epic.

## 2018-12-23 ENCOUNTER — Encounter (HOSPITAL_COMMUNITY): Payer: Self-pay | Admitting: Anesthesiology

## 2018-12-23 NOTE — Anesthesia Preprocedure Evaluation (Addendum)
Anesthesia Evaluation  Patient identified by MRN, date of birth, ID band Patient awake    Reviewed: Allergy & Precautions, NPO status , Patient's Chart, lab work & pertinent test results  Airway Mallampati: II  TM Distance: >3 FB Neck ROM: Full    Dental no notable dental hx. (+) Teeth Intact, Caps   Pulmonary former smoker,    Pulmonary exam normal breath sounds clear to auscultation       Cardiovascular hypertension, Pt. 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   Abdominal   Peds  Hematology Eliquis therapy -last dose 1 week ago   Anesthesia Other Findings   Reproductive/Obstetrics                          Anesthesia Physical Anesthesia Plan  ASA: III  Anesthesia Plan: Spinal   Post-op Pain Management:    Induction:   PONV Risk Score and Plan: 2 and Propofol infusion, Ondansetron and Treatment may vary due to age or medical condition  Airway Management Planned: Natural Airway, Simple Face Mask and Nasal Cannula  Additional Equipment:   Intra-op Plan:   Post-operative Plan:   Informed Consent: I have reviewed the patients History and Physical, chart, labs and discussed the procedure including the risks, benefits and alternatives for the proposed anesthesia with the patient or authorized representative who has indicated his/her understanding and acceptance.   Dental advisory given  Plan  Discussed with: CRNA and Surgeon  Anesthesia Plan Comments:        Anesthesia Quick Evaluation

## 2018-12-24 ENCOUNTER — Encounter (HOSPITAL_COMMUNITY): Payer: Self-pay

## 2018-12-24 ENCOUNTER — Inpatient Hospital Stay (HOSPITAL_COMMUNITY): Payer: Medicare Other | Admitting: Anesthesiology

## 2018-12-24 ENCOUNTER — Inpatient Hospital Stay (HOSPITAL_COMMUNITY): Payer: Medicare Other

## 2018-12-24 ENCOUNTER — Encounter (HOSPITAL_COMMUNITY)
Admission: RE | Disposition: A | Discharge status: Home / Self Care | Payer: Self-pay | Source: Home / Self Care | Attending: Orthopedic Surgery

## 2018-12-24 ENCOUNTER — Other Ambulatory Visit: Payer: Self-pay

## 2018-12-24 ENCOUNTER — Inpatient Hospital Stay (HOSPITAL_COMMUNITY)
Admission: RE | Admit: 2018-12-24 | Discharge: 2018-12-25 | DRG: 470 | Disposition: A | Discharge status: Home / Self Care | Payer: Medicare Other | Attending: Orthopedic Surgery | Admitting: Orthopedic Surgery

## 2018-12-24 DIAGNOSIS — Z419 Encounter for procedure for purposes other than remedying health state, unspecified: Secondary | ICD-10-CM

## 2018-12-24 DIAGNOSIS — Y838 Other surgical procedures as the cause of abnormal reaction of the patient, or of later complication, without mention of misadventure at the time of the procedure: Secondary | ICD-10-CM | POA: Diagnosis present

## 2018-12-24 DIAGNOSIS — S72001K Fracture of unspecified part of neck of right femur, subsequent encounter for closed fracture with nonunion: Secondary | ICD-10-CM

## 2018-12-24 DIAGNOSIS — H9190 Unspecified hearing loss, unspecified ear: Secondary | ICD-10-CM | POA: Diagnosis present

## 2018-12-24 DIAGNOSIS — I4891 Unspecified atrial fibrillation: Secondary | ICD-10-CM | POA: Diagnosis present

## 2018-12-24 DIAGNOSIS — I2510 Atherosclerotic heart disease of native coronary artery without angina pectoris: Secondary | ICD-10-CM | POA: Diagnosis present

## 2018-12-24 DIAGNOSIS — Z96641 Presence of right artificial hip joint: Secondary | ICD-10-CM

## 2018-12-24 DIAGNOSIS — E1142 Type 2 diabetes mellitus with diabetic polyneuropathy: Secondary | ICD-10-CM | POA: Diagnosis present

## 2018-12-24 DIAGNOSIS — I110 Hypertensive heart disease with heart failure: Secondary | ICD-10-CM | POA: Diagnosis present

## 2018-12-24 DIAGNOSIS — T84194A Other mechanical complication of internal fixation device of right femur, initial encounter: Secondary | ICD-10-CM | POA: Diagnosis present

## 2018-12-24 DIAGNOSIS — Z79891 Long term (current) use of opiate analgesic: Secondary | ICD-10-CM

## 2018-12-24 DIAGNOSIS — Z85828 Personal history of other malignant neoplasm of skin: Secondary | ICD-10-CM

## 2018-12-24 DIAGNOSIS — Z96649 Presence of unspecified artificial hip joint: Secondary | ICD-10-CM

## 2018-12-24 DIAGNOSIS — M5010 Cervical disc disorder with radiculopathy, unspecified cervical region: Secondary | ICD-10-CM | POA: Diagnosis present

## 2018-12-24 DIAGNOSIS — I5022 Chronic systolic (congestive) heart failure: Secondary | ICD-10-CM | POA: Diagnosis present

## 2018-12-24 DIAGNOSIS — E039 Hypothyroidism, unspecified: Secondary | ICD-10-CM | POA: Diagnosis present

## 2018-12-24 DIAGNOSIS — N400 Enlarged prostate without lower urinary tract symptoms: Secondary | ICD-10-CM | POA: Diagnosis present

## 2018-12-24 DIAGNOSIS — E782 Mixed hyperlipidemia: Secondary | ICD-10-CM | POA: Diagnosis present

## 2018-12-24 DIAGNOSIS — M1711 Unilateral primary osteoarthritis, right knee: Secondary | ICD-10-CM | POA: Diagnosis present

## 2018-12-24 DIAGNOSIS — Z87891 Personal history of nicotine dependence: Secondary | ICD-10-CM

## 2018-12-24 DIAGNOSIS — I428 Other cardiomyopathies: Secondary | ICD-10-CM | POA: Diagnosis present

## 2018-12-24 DIAGNOSIS — Z79890 Hormone replacement therapy (postmenopausal): Secondary | ICD-10-CM

## 2018-12-24 DIAGNOSIS — Z7901 Long term (current) use of anticoagulants: Secondary | ICD-10-CM

## 2018-12-24 DIAGNOSIS — M1611 Unilateral primary osteoarthritis, right hip: Secondary | ICD-10-CM | POA: Diagnosis present

## 2018-12-24 DIAGNOSIS — Z79899 Other long term (current) drug therapy: Secondary | ICD-10-CM | POA: Diagnosis not present

## 2018-12-24 HISTORY — PX: CONVERSION TO TOTAL HIP: SHX5784

## 2018-12-24 LAB — TYPE AND SCREEN
ABO/RH(D): A POS
Antibody Screen: NEGATIVE

## 2018-12-24 SURGERY — CONVERSION, PREVIOUS HIP SURGERY, TO TOTAL HIP ARTHROPLASTY
Anesthesia: Spinal | Laterality: Right

## 2018-12-24 MED ORDER — MAGNESIUM CITRATE PO SOLN: 1.0000 | Freq: Once | ORAL | Status: DC | PRN

## 2018-12-24 MED ORDER — METHOCARBAMOL 500 MG PO TABS: 500.0000 mg | ORAL_TABLET | Freq: Four times a day (QID) | ORAL | 0 refills | Status: DC | PRN

## 2018-12-24 MED ORDER — HYDROMORPHONE HCL 1 MG/ML IJ SOLN
0.2500 mg | INTRAMUSCULAR | Status: DC | PRN
  Administered 2018-12-24 (×4): 0.5 mg via INTRAVENOUS

## 2018-12-24 MED ORDER — DOCUSATE SODIUM 100 MG PO CAPS
100.0000 mg | ORAL_CAPSULE | Freq: Two times a day (BID) | ORAL | Status: DC
  Administered 2018-12-24 – 2018-12-25 (×3): 100 mg via ORAL
  Filled 2018-12-24 (×3): qty 1

## 2018-12-24 MED ORDER — SODIUM CHLORIDE 0.9 % IV SOLN
INTRAVENOUS | Status: DC
  Administered 2018-12-24 – 2018-12-25 (×2): via INTRAVENOUS

## 2018-12-24 MED ORDER — LOSARTAN POTASSIUM 50 MG PO TABS
100.0000 mg | ORAL_TABLET | Freq: Every day | ORAL | Status: DC
  Administered 2018-12-24: 100 mg via ORAL
  Filled 2018-12-24 (×2): qty 2

## 2018-12-24 MED ORDER — HYDROMORPHONE HCL 1 MG/ML IJ SOLN
INTRAMUSCULAR | Status: AC
  Filled 2018-12-24: qty 1

## 2018-12-24 MED ORDER — FERROUS SULFATE 325 (65 FE) MG PO TABS: 325.0000 mg | ORAL_TABLET | Freq: Three times a day (TID) | ORAL | 3 refills | Status: DC

## 2018-12-24 MED ORDER — TRANEXAMIC ACID-NACL 1000-0.7 MG/100ML-% IV SOLN
1000.0000 mg | Freq: Once | INTRAVENOUS | Status: AC
  Administered 2018-12-24: 1000 mg via INTRAVENOUS
  Filled 2018-12-24: qty 100

## 2018-12-24 MED ORDER — ACETAMINOPHEN 325 MG PO TABS: 325.0000 mg | ORAL_TABLET | Freq: Four times a day (QID) | ORAL | Status: DC | PRN

## 2018-12-24 MED ORDER — HYDROCODONE-ACETAMINOPHEN 5-325 MG PO TABS
1.0000 | ORAL_TABLET | ORAL | Status: DC | PRN
  Administered 2018-12-24 (×2): 1 via ORAL
  Filled 2018-12-24 (×2): qty 1

## 2018-12-24 MED ORDER — PROPOFOL 10 MG/ML IV BOLUS
INTRAVENOUS | Status: AC
  Filled 2018-12-24: qty 20

## 2018-12-24 MED ORDER — METOCLOPRAMIDE HCL 5 MG/ML IJ SOLN: 5.0000 mg | Freq: Three times a day (TID) | INTRAMUSCULAR | Status: DC | PRN

## 2018-12-24 MED ORDER — METHOCARBAMOL 500 MG IVPB - SIMPLE MED
INTRAVENOUS | Status: AC
  Filled 2018-12-24: qty 50

## 2018-12-24 MED ORDER — CEFAZOLIN SODIUM-DEXTROSE 2-4 GM/100ML-% IV SOLN
2.0000 g | Freq: Four times a day (QID) | INTRAVENOUS | Status: AC
  Administered 2018-12-24 (×2): 2 g via INTRAVENOUS
  Filled 2018-12-24 (×2): qty 100

## 2018-12-24 MED ORDER — METHOCARBAMOL 500 MG PO TABS: 500.0000 mg | ORAL_TABLET | Freq: Four times a day (QID) | ORAL | Status: DC | PRN

## 2018-12-24 MED ORDER — DEXAMETHASONE SODIUM PHOSPHATE 10 MG/ML IJ SOLN
10.0000 mg | Freq: Once | INTRAMUSCULAR | Status: AC
  Administered 2018-12-25: 10 mg via INTRAVENOUS
  Filled 2018-12-24: qty 1

## 2018-12-24 MED ORDER — ONDANSETRON HCL 4 MG/2ML IJ SOLN: 4.0000 mg | Freq: Four times a day (QID) | INTRAMUSCULAR | Status: DC | PRN

## 2018-12-24 MED ORDER — METOCLOPRAMIDE HCL 5 MG PO TABS: 5.0000 mg | ORAL_TABLET | Freq: Three times a day (TID) | ORAL | Status: DC | PRN

## 2018-12-24 MED ORDER — CELECOXIB 200 MG PO CAPS
200.0000 mg | ORAL_CAPSULE | Freq: Two times a day (BID) | ORAL | Status: DC
  Administered 2018-12-24 – 2018-12-25 (×3): 200 mg via ORAL
  Filled 2018-12-24 (×3): qty 1

## 2018-12-24 MED ORDER — AMLODIPINE BESYLATE 5 MG PO TABS
5.0000 mg | ORAL_TABLET | Freq: Every morning | ORAL | Status: DC
  Filled 2018-12-24: qty 1

## 2018-12-24 MED ORDER — POLYETHYLENE GLYCOL 3350 17 G PO PACK: 17.0000 g | PACK | Freq: Two times a day (BID) | ORAL | 0 refills | Status: DC

## 2018-12-24 MED ORDER — PROPOFOL 500 MG/50ML IV EMUL
INTRAVENOUS | Status: DC | PRN
  Administered 2018-12-24: 100 ug/kg/min via INTRAVENOUS

## 2018-12-24 MED ORDER — NITROGLYCERIN 0.4 MG SL SUBL: 0.4000 mg | SUBLINGUAL_TABLET | SUBLINGUAL | Status: DC | PRN

## 2018-12-24 MED ORDER — SODIUM CHLORIDE (PF) 0.9 % IJ SOLN
INTRAMUSCULAR | Status: AC
  Filled 2018-12-24: qty 10

## 2018-12-24 MED ORDER — APIXABAN 2.5 MG PO TABS
2.5000 mg | ORAL_TABLET | Freq: Two times a day (BID) | ORAL | Status: DC
  Administered 2018-12-25: 2.5 mg via ORAL
  Filled 2018-12-24: qty 1

## 2018-12-24 MED ORDER — PHENYLEPHRINE 40 MCG/ML (10ML) SYRINGE FOR IV PUSH (FOR BLOOD PRESSURE SUPPORT)
PREFILLED_SYRINGE | INTRAVENOUS | Status: AC
  Filled 2018-12-24: qty 10

## 2018-12-24 MED ORDER — ONDANSETRON HCL 4 MG PO TABS: 4.0000 mg | ORAL_TABLET | Freq: Four times a day (QID) | ORAL | Status: DC | PRN

## 2018-12-24 MED ORDER — BISACODYL 10 MG RE SUPP: 10.0000 mg | Freq: Every day | RECTAL | Status: DC | PRN

## 2018-12-24 MED ORDER — HYDROMORPHONE HCL 1 MG/ML IJ SOLN: 0.5000 mg | INTRAMUSCULAR | Status: DC | PRN

## 2018-12-24 MED ORDER — DIPHENHYDRAMINE HCL 12.5 MG/5ML PO ELIX: 12.5000 mg | ORAL_SOLUTION | ORAL | Status: DC | PRN

## 2018-12-24 MED ORDER — LACTATED RINGERS IV SOLN
INTRAVENOUS | Status: DC
  Administered 2018-12-24 (×2): via INTRAVENOUS

## 2018-12-24 MED ORDER — MIDAZOLAM HCL 2 MG/2ML IJ SOLN
INTRAMUSCULAR | Status: AC
  Filled 2018-12-24: qty 2

## 2018-12-24 MED ORDER — ONDANSETRON HCL 4 MG/2ML IJ SOLN
INTRAMUSCULAR | Status: DC | PRN
  Administered 2018-12-24: 4 mg via INTRAVENOUS

## 2018-12-24 MED ORDER — LEVOTHYROXINE SODIUM 75 MCG PO TABS
37.5000 ug | ORAL_TABLET | ORAL | Status: DC
  Administered 2018-12-24: 37.5 ug via ORAL
  Filled 2018-12-24: qty 1

## 2018-12-24 MED ORDER — ONDANSETRON HCL 4 MG/2ML IJ SOLN: 4.0000 mg | Freq: Once | INTRAMUSCULAR | Status: DC | PRN

## 2018-12-24 MED ORDER — BUPIVACAINE IN DEXTROSE 0.75-8.25 % IT SOLN
INTRATHECAL | Status: DC | PRN
  Administered 2018-12-24: 1.8 mL via INTRATHECAL

## 2018-12-24 MED ORDER — TRAZODONE HCL 50 MG PO TABS
50.0000 mg | ORAL_TABLET | Freq: Every day | ORAL | Status: DC
  Administered 2018-12-24: 50 mg via ORAL
  Filled 2018-12-24: qty 1

## 2018-12-24 MED ORDER — ALPRAZOLAM 0.5 MG PO TABS
0.5000 mg | ORAL_TABLET | Freq: Every day | ORAL | Status: DC
  Administered 2018-12-24: 0.5 mg via ORAL
  Filled 2018-12-24: qty 1

## 2018-12-24 MED ORDER — SODIUM CHLORIDE 0.9 % IR SOLN
Status: DC | PRN
  Administered 2018-12-24: 1000 mL

## 2018-12-24 MED ORDER — HYDROCODONE-ACETAMINOPHEN 7.5-325 MG PO TABS: 1.0000 | ORAL_TABLET | ORAL | 0 refills | Status: DC | PRN

## 2018-12-24 MED ORDER — HYDROMORPHONE HCL 1 MG/ML IJ SOLN
0.5000 mg | INTRAMUSCULAR | Status: DC | PRN
  Administered 2018-12-24: 1 mg via INTRAVENOUS
  Filled 2018-12-24: qty 1

## 2018-12-24 MED ORDER — CEFAZOLIN SODIUM-DEXTROSE 2-4 GM/100ML-% IV SOLN
2.0000 g | INTRAVENOUS | Status: AC
  Administered 2018-12-24: 2 g via INTRAVENOUS
  Filled 2018-12-24: qty 100

## 2018-12-24 MED ORDER — CHLORHEXIDINE GLUCONATE 4 % EX LIQD: 60.0000 mL | Freq: Once | CUTANEOUS | Status: DC

## 2018-12-24 MED ORDER — LIDOCAINE 2% (20 MG/ML) 5 ML SYRINGE
INTRAMUSCULAR | Status: AC
  Filled 2018-12-24: qty 5

## 2018-12-24 MED ORDER — DEXAMETHASONE SODIUM PHOSPHATE 10 MG/ML IJ SOLN
INTRAMUSCULAR | Status: AC
  Filled 2018-12-24: qty 1

## 2018-12-24 MED ORDER — DOCUSATE SODIUM 100 MG PO CAPS: 100.0000 mg | ORAL_CAPSULE | Freq: Two times a day (BID) | ORAL | 0 refills | Status: DC

## 2018-12-24 MED ORDER — FENTANYL CITRATE (PF) 100 MCG/2ML IJ SOLN
INTRAMUSCULAR | Status: DC | PRN
  Administered 2018-12-24: 50 ug via INTRAVENOUS

## 2018-12-24 MED ORDER — ONDANSETRON HCL 4 MG/2ML IJ SOLN
INTRAMUSCULAR | Status: AC
  Filled 2018-12-24: qty 2

## 2018-12-24 MED ORDER — TAMSULOSIN HCL 0.4 MG PO CAPS
0.4000 mg | ORAL_CAPSULE | Freq: Every day | ORAL | Status: DC
  Administered 2018-12-25: 0.4 mg via ORAL
  Filled 2018-12-24: qty 1

## 2018-12-24 MED ORDER — DEXAMETHASONE SODIUM PHOSPHATE 10 MG/ML IJ SOLN: 10.0000 mg | Freq: Once | INTRAMUSCULAR | Status: DC

## 2018-12-24 MED ORDER — PHENYLEPHRINE 40 MCG/ML (10ML) SYRINGE FOR IV PUSH (FOR BLOOD PRESSURE SUPPORT)
PREFILLED_SYRINGE | INTRAVENOUS | Status: DC | PRN
  Administered 2018-12-24 (×3): 80 ug via INTRAVENOUS
  Administered 2018-12-24: 40 ug via INTRAVENOUS
  Administered 2018-12-24: 80 ug via INTRAVENOUS
  Administered 2018-12-24: 40 ug via INTRAVENOUS

## 2018-12-24 MED ORDER — FERROUS SULFATE 325 (65 FE) MG PO TABS
325.0000 mg | ORAL_TABLET | Freq: Three times a day (TID) | ORAL | Status: DC
  Administered 2018-12-24 – 2018-12-25 (×4): 325 mg via ORAL
  Filled 2018-12-24 (×3): qty 1

## 2018-12-24 MED ORDER — METHOCARBAMOL 500 MG IVPB - SIMPLE MED
500.0000 mg | Freq: Four times a day (QID) | INTRAVENOUS | Status: DC | PRN
  Administered 2018-12-24: 500 mg via INTRAVENOUS
  Filled 2018-12-24: qty 50

## 2018-12-24 MED ORDER — PHENOL 1.4 % MT LIQD: 1.0000 | OROMUCOSAL | Status: DC | PRN

## 2018-12-24 MED ORDER — DEXAMETHASONE SODIUM PHOSPHATE 10 MG/ML IJ SOLN
INTRAMUSCULAR | Status: DC | PRN
  Administered 2018-12-24: 10 mg via INTRAVENOUS

## 2018-12-24 MED ORDER — MENTHOL 3 MG MT LOZG: 1.0000 | LOZENGE | OROMUCOSAL | Status: DC | PRN

## 2018-12-24 MED ORDER — POLYETHYLENE GLYCOL 3350 17 G PO PACK
17.0000 g | PACK | Freq: Two times a day (BID) | ORAL | Status: DC
  Administered 2018-12-24 – 2018-12-25 (×3): 17 g via ORAL
  Filled 2018-12-24 (×3): qty 1

## 2018-12-24 MED ORDER — TRANEXAMIC ACID-NACL 1000-0.7 MG/100ML-% IV SOLN
1000.0000 mg | INTRAVENOUS | Status: AC
  Administered 2018-12-24: 1000 mg via INTRAVENOUS
  Filled 2018-12-24: qty 100

## 2018-12-24 MED ORDER — FENTANYL CITRATE (PF) 100 MCG/2ML IJ SOLN
INTRAMUSCULAR | Status: AC
  Filled 2018-12-24: qty 2

## 2018-12-24 MED ORDER — HYDROCODONE-ACETAMINOPHEN 7.5-325 MG PO TABS
1.0000 | ORAL_TABLET | ORAL | Status: DC | PRN
  Administered 2018-12-24: 1 via ORAL
  Administered 2018-12-24: 2 via ORAL
  Administered 2018-12-25 (×2): 1 via ORAL
  Filled 2018-12-24 (×3): qty 1
  Filled 2018-12-24: qty 2

## 2018-12-24 MED ORDER — ALUM & MAG HYDROXIDE-SIMETH 200-200-20 MG/5ML PO SUSP: 15.0000 mL | ORAL | Status: DC | PRN

## 2018-12-24 MED ORDER — MEPERIDINE HCL 50 MG/ML IJ SOLN: 6.2500 mg | INTRAMUSCULAR | Status: DC | PRN

## 2018-12-24 MED ORDER — PROPOFOL 10 MG/ML IV BOLUS
INTRAVENOUS | Status: DC | PRN
  Administered 2018-12-24 (×2): 20 mg via INTRAVENOUS
  Administered 2018-12-24 (×2): 30 mg via INTRAVENOUS
  Administered 2018-12-24: 20 mg via INTRAVENOUS

## 2018-12-24 SURGICAL SUPPLY — 46 items
ARTICULEZE HEAD (Hips) ×3 IMPLANT
BAG DECANTER FOR FLEXI CONT (MISCELLANEOUS) IMPLANT
BAG ZIPLOCK 12X15 (MISCELLANEOUS) IMPLANT
BLADE SAG 18X100X1.27 (BLADE) ×3 IMPLANT
BLADE SURG SZ10 CARB STEEL (BLADE) ×6 IMPLANT
COVER PERINEAL POST (MISCELLANEOUS) ×3 IMPLANT
COVER SURGICAL LIGHT HANDLE (MISCELLANEOUS) ×3 IMPLANT
COVER WAND RF STERILE (DRAPES) ×3 IMPLANT
CUP ACET PINNACLE SECTR 56MM (Hips) ×1 IMPLANT
DERMABOND ADVANCED (GAUZE/BANDAGES/DRESSINGS) ×4
DERMABOND ADVANCED .7 DNX12 (GAUZE/BANDAGES/DRESSINGS) ×2 IMPLANT
DRAPE STERI IOBAN 125X83 (DRAPES) ×3 IMPLANT
DRAPE U-SHAPE 47X51 STRL (DRAPES) ×6 IMPLANT
DRESSING AQUACEL AG SP 3.5X10 (GAUZE/BANDAGES/DRESSINGS) ×1 IMPLANT
DRSG AQUACEL AG ADV 3.5X 4 (GAUZE/BANDAGES/DRESSINGS) ×3 IMPLANT
DRSG AQUACEL AG ADV 3.5X10 (GAUZE/BANDAGES/DRESSINGS) ×3 IMPLANT
DRSG AQUACEL AG SP 3.5X10 (GAUZE/BANDAGES/DRESSINGS) ×3
DURAPREP 26ML APPLICATOR (WOUND CARE) ×6 IMPLANT
ELECT REM PT RETURN 15FT ADLT (MISCELLANEOUS) ×3 IMPLANT
ELIMINATOR HOLE APEX DEPUY (Hips) ×3 IMPLANT
GLOVE BIOGEL M STRL SZ7.5 (GLOVE) IMPLANT
GLOVE BIOGEL PI IND STRL 7.5 (GLOVE) ×1 IMPLANT
GLOVE BIOGEL PI IND STRL 8.5 (GLOVE) ×1 IMPLANT
GLOVE BIOGEL PI INDICATOR 7.5 (GLOVE) ×2
GLOVE BIOGEL PI INDICATOR 8.5 (GLOVE) ×2
GLOVE ECLIPSE 8.0 STRL XLNG CF (GLOVE) ×6 IMPLANT
GLOVE ORTHO TXT STRL SZ7.5 (GLOVE) ×3 IMPLANT
GOWN STRL REUS W/TWL 2XL LVL3 (GOWN DISPOSABLE) ×3 IMPLANT
GOWN STRL REUS W/TWL LRG LVL3 (GOWN DISPOSABLE) ×3 IMPLANT
HEAD ARTICULEZE (Hips) ×1 IMPLANT
HOLDER FOLEY CATH W/STRAP (MISCELLANEOUS) ×3 IMPLANT
PACK ANTERIOR HIP CUSTOM (KITS) ×3 IMPLANT
PIN GUIDE DRILL TIP 2.8X300 (DRILL) ×3 IMPLANT
PINNACLE ALTRX PLUS 4 N 36X56 (Hips) ×3 IMPLANT
PINNACLE SECTOR CUP 56MM (Hips) ×3 IMPLANT
SCREW 6.5MMX30MM (Screw) ×3 IMPLANT
STEM FEM ACTIS HIGH SZ10 (Stem) ×3 IMPLANT
SUT MNCRL AB 4-0 PS2 18 (SUTURE) ×3 IMPLANT
SUT STRATAFIX 0 PDS 27 VIOLET (SUTURE) ×3
SUT VIC AB 1 CT1 36 (SUTURE) ×9 IMPLANT
SUT VIC AB 2-0 CT1 27 (SUTURE) ×4
SUT VIC AB 2-0 CT1 TAPERPNT 27 (SUTURE) ×2 IMPLANT
SUTURE STRATFX 0 PDS 27 VIOLET (SUTURE) ×1 IMPLANT
TRAY FOLEY MTR SLVR 16FR STAT (SET/KITS/TRAYS/PACK) ×3 IMPLANT
WATER STERILE IRR 1000ML POUR (IV SOLUTION) ×6 IMPLANT
YANKAUER SUCT BULB TIP 10FT TU (MISCELLANEOUS) IMPLANT

## 2018-12-24 NOTE — Interval H&P Note (Signed)
History and Physical Interval Note:  12/24/2018 7:04 AM  Joshua Carney  has presented today for surgery, with the diagnosis of Status post right hip percutaneous screw fixation with delayed healing  The various methods of treatment have been discussed with the patient and family. After consideration of risks, benefits and other options for treatment, the patient has consented to  Procedure(s) with comments: CONVERSION TO RIGHT ANTERIOR TOTAL HIP (Right) - 90 mins as a surgical intervention .  The patient's history has been reviewed, patient examined, no change in status, stable for surgery.  I have reviewed the patient's chart and labs.  Questions were answered to the patient's satisfaction.     [name]

## 2018-12-24 NOTE — Discharge Instructions (Signed)

## 2018-12-24 NOTE — Anesthesia Postprocedure Evaluation (Signed)
Anesthesia Post Note  Patient: MAJED PELLEGRIN  Procedure(s) Performed: CONVERSION TO RIGHT ANTERIOR TOTAL HIP (Right )     Patient location during evaluation: PACU Anesthesia Type: Spinal Level of consciousness: oriented and awake and alert Pain management: pain level controlled Vital Signs Assessment: post-procedure vital signs reviewed and stable Respiratory status: spontaneous breathing, respiratory function stable and nonlabored ventilation Cardiovascular status: blood pressure returned to baseline and stable Postop Assessment: no headache, no backache, no apparent nausea or vomiting, spinal receding and patient able to bend at knees Anesthetic complications: no    Last Vitals:  Vitals:   12/24/18 1015 12/24/18 1030  BP: (!) 113/57 (!) 120/59  Pulse: 68 65  Resp: 15 15  Temp: 36.6 C   SpO2: 97% 99%    Last Pain:  Vitals:   12/24/18 1030  TempSrc:   PainSc: 5                  Annalaya Wile A.

## 2018-12-24 NOTE — Anesthesia Procedure Notes (Signed)
Spinal  Patient location during procedure: OR Start time: 12/24/2018 7:15 AM End time: 12/24/2018 7:21 AM Staffing Anesthesiologist: Josephine Igo, MD Performed: anesthesiologist  Preanesthetic Checklist Completed: patient identified, site marked, surgical consent, pre-op evaluation, timeout performed, IV checked, risks and benefits discussed and monitors and equipment checked Spinal Block Patient position: sitting Prep: site prepped and draped and DuraPrep Patient monitoring: heart rate, cardiac monitor, continuous pulse ox and blood pressure Approach: midline Location: L3-4 Injection technique: single-shot Needle Needle type: Sprotte  Needle gauge: 24 G Needle length: 9 cm Needle insertion depth: 6 cm Assessment Sensory level: T4 Additional Notes Patient tolerated procedure well. Adequate sensory level.

## 2018-12-24 NOTE — Op Note (Signed)
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       PROCEDURE: Conversion of failed right hip surgery to right total hip replacement through an anterior approach   utilizing DePuy THR system, component size 49mm pinnacle cup, a size 36+4 neutral   Altrex liner, a size 10 hi Actis femoral stem with a 36+5 Articuleze metal head ball.  Conversion included making a separate incision to remove the 3 cannulated screws.     SURGEON:  Pietro Cassis. Alvan Dame, M.D.      ASSISTANT:  Danae Orleans, PA-C     ANESTHESIA:  Spinal.      SPECIMENS:  None.      COMPLICATIONS:  None.      BLOOD LOSS:  500 cc     DRAINS:  None.      INDICATION OF THE PROCEDURE:  Joshua Carney is a 84 y.o. male with history of right mid cervical femoral neck fracture that was treated with close reduction percutaneous cannulated screw fixation.  He had been followed in the office and now initially had been doing well began developing pain in his right hip.  Correlating with his clinical presentation radiographs indicated nonunion with partial backing out of his screws as well as migration of the screws into the femoral head up to but not penetrating the subchondral plate.  Due to the discomfort that he was experiencing he wished to proceed with definitive treatment.  This consisted of removing the screws and performing a total hip arthroplasty.  Consent was obtained for   benefit of pain relief.  Specific  risks of infection, DVT, component   failure, dislocation, neurovascular injury, and need for revision surgery were reviewed in the office as well discussion of   the anterior versus posterior approach were reviewed.     PROCEDURE IN DETAIL:  The patient was brought to operative theater.   Once adequate anesthesia, preoperative antibiotics, 2 gm of Ancef, 1 gm of Tranexamic Acid, and 10 mg of Decadron were administered, the patient was positioned supine on the [employer] table.  Once the patient was safely positioned with adequate padding of boney prominences we predraped out the hip, and used fluoroscopy to confirm orientation of the pelvis.      The right hip was then prepped and draped from proximal iliac crest to   mid thigh with a shower curtain technique.      Time-out was performed identifying the patient, planned procedures, and the appropriate extremity.    First attention was aimed at removing the percutaneous screws.  Based on the landmarks in the anterior based incision I elected to use his old incision for screw  removal.  An incision was made laterally down through the iliotibial band.  I then bluntly dissected to the area of the screw heads.  Then under fluoroscopic imaging I used a guidewire and was able to identify the screws by passing the guidewire through the cannulated portion.  Each of the screws was removed in this fashion using a screwdriver.  Once the screws were removed I irrigated this lateral wound.  I then reapproximated the iliotibial band with #1 Vicryl in interrupted fashion.  This wound was irrigated and packed as we turned attention towards the total hip replacement.  An incision was then made 2 cm lateral to the   anterior superior iliac spine extending over the orientation of the   tensor fascia lata muscle and sharp dissection was carried down to the   fascia of the muscle.      The fascia was then incised.  The muscle belly was identified and swept   laterally  and retractor placed along the superior neck.  Following   cauterization of the circumflex vessels and removing some pericapsular   fat, a second cobra retractor was placed on the inferior neck.  A capsulectomy was made due to the adherent nature of it over the fracture site.  The fracture nonunion was identified as following removal of the screws the fracture site had no evidence of healing.  I first made a neck osteotomy with an oscillating saw from the trochanteric fossa towards the medial calcar.  The ring of bone from the fracture site to the osteotomy was removed using a rondure. The femoral   head was removed without difficulty or complication.  Traction was let   off and retractors were placed posterior and anterior around the   acetabulum.      The labrum and foveal tissue were debrided.  I began reaming with a 48 mm   reamer and reamed up to 55 mm reamer with good bony bed preparation and a 56 mm  cup was chosen.  The final 56 mm Pinnacle cup was then impacted under fluoroscopy to confirm the depth of penetration and orientation with respect to   Abduction and forward flexion.  A screw was placed into the ilium followed by the hole eliminator.  The final   36+4 neutral Altrex liner was impacted with good visualized rim fit.  The cup was positioned anatomically within the acetabular portion of the pelvis.      At this point, the femur was rolled to 100 degrees.  Further capsule was   released off the inferior aspect of the femoral neck.  I then   released the superior capsule proximally.  With the leg in a neutral position the hook was placed laterally   along the femur under the vastus lateralis origin and elevated manually and then held in position using the hook attachment on the bed.  The leg was then extended and adducted with the leg rolled to 100   degrees of external rotation.  Retractors were placed along the medial calcar and posteriorly over the greater trochanter.  Once the  proximal femur was fully   exposed, I used a box osteotome to set orientation.  I then began   broaching with the starting chili pepper broach and passed this by hand and then broached up to 10.  With the 10 broach in place I chose a high offset neck and did several trial reductions.  The offset was appropriate, leg lengths   appeared to be  equal best matched with the +5 head ball trial confirmed radiographically.   Given these findings, I went ahead and dislocated the hip, repositioned all   retractors and positioned the right hip in the extended and abducted position.  The final 10 Hi Actis femoral stem was   chosen and it was impacted down to the level of neck cut.  Based on this   and the trial reductions, a final 36+5 Articuleze metal head ball was chosen and   impacted onto a clean and dry trunnion, and the hip was reduced.  The   hip had been irrigated throughout the case again at this point.  The fascia of the   tensor fascia lata muscle was then reapproximated using #1 Vicryl and #0 Stratafix sutures.  The   remaining wound was closed with 2-0 Vicryl and running 4-0 Monocryl.   The hip was cleaned, dried, and dressed sterilely using Dermabond and   Aquacel dressing.  The patient was then brought   to recovery room in stable condition tolerating the procedure well.    Danae Orleans and Griffith Citron, PA-Cs were present for the entirety of the case involved from   preoperative positioning, perioperative retractor management, general   facilitation of the case, as well as primary wound closure as assistant.            Pietro Cassis Alvan Dame, M.D.        12/24/2018 9:05 AM

## 2018-12-24 NOTE — Anesthesia Procedure Notes (Signed)
Date/Time: 12/24/2018 7:13 AM Performed by: Sharlette Dense, CRNA Oxygen Delivery Method: Simple face mask

## 2018-12-24 NOTE — Transfer of Care (Signed)
Immediate Anesthesia Transfer of Care Note  Patient: Joshua Carney  Procedure(s) Performed: CONVERSION TO RIGHT ANTERIOR TOTAL HIP (Right )  Patient Location: PACU  Anesthesia Type:Spinal  Level of Consciousness: awake, alert  and oriented  Airway & Oxygen Therapy: Patient Spontanous Breathing and Patient connected to nasal cannula oxygen  Post-op Assessment: Report given to RN and Post -op Vital signs reviewed and stable  Post vital signs: Reviewed and stable  Last Vitals:  Vitals Value Taken Time  BP    Temp    Pulse 74 12/24/2018  9:45 AM  Resp    SpO2 93 % 12/24/2018  9:45 AM  Vitals shown include unvalidated device data.  Last Pain:  Vitals:   12/24/18 0613  TempSrc:   PainSc: 0-No pain         Complications: No apparent anesthesia complications

## 2018-12-24 NOTE — Evaluation (Signed)
Physical Therapy Evaluation Patient Details Name: Joshua Carney MRN: [id number] DOB: Oct 23, 1934 Today's Date: 12/24/2018   History of Present Illness  Pt s/p L THR conversion from non-healing pinning.  Pt with hx of L TKR , nonischemic cardiomyopathy, and peripheral neuropathy  Clinical Impression  Pt s/p L THR and presents with decreased L LE strength/ROM and post op pain limiting functional mobility.  Pt should progress to dc home with family assist.    Follow Up Recommendations Follow surgeon's recommendation for DC plan and follow-up therapies    Equipment Recommendations  None recommended by PT    Recommendations for Other Services       Precautions / Restrictions Precautions Precautions: Fall Restrictions Weight Bearing Restrictions: No Other Position/Activity Restrictions: WBAT      Mobility  Bed Mobility Overal bed mobility: Needs Assistance Bed Mobility: Supine to Sit     Supine to sit: Min assist     General bed mobility comments: cues for seqence and use of R LE to self assist  Transfers Overall transfer level: Needs assistance Equipment used: Rolling walker (2 wheeled) Transfers: Sit to/from Stand Sit to Stand: Min assist         General transfer comment: cues for LE management and use of UE to self assist  Ambulation/Gait Ambulation/Gait assistance: Min assist Gait Distance (Feet): 45 Feet Assistive device: Rolling walker (2 wheeled) Gait Pattern/deviations: Step-to pattern;Decreased step length - right;Decreased step length - left;Shuffle;Trunk flexed Gait velocity: decr   General Gait Details: cues for sequence, posture and position from [employer]            Wheelchair Mobility    Modified Rankin (Stroke Patients Only)       Balance Overall balance assessment: Mild deficits observed, not formally tested                                           Pertinent Vitals/Pain Pain Assessment: 0-10 Pain Score: 3   Pain Location: R hip Pain Descriptors / Indicators: Aching;Sore Pain Intervention(s): Limited activity within patient's tolerance;Monitored during session;Premedicated before session;Ice applied    Home Living Family/patient expects to be discharged to:: Private residence Living Arrangements: Spouse/significant other Available Help at Discharge: Family Type of Home: House Home Access: Stairs to enter   Technical brewer of Steps: 1 Home Layout: Two level Home Equipment: [place] - single point;Walker - 4 wheels;Crutches;Walker - 2 wheels      Prior Function Level of Independence: Independent with assistive device(s);Independent         Comments: using crutches since fall     Hand Dominance        Extremity/Trunk Assessment   Upper Extremity Assessment Upper Extremity Assessment: Overall WFL for tasks assessed    Lower Extremity Assessment Lower Extremity Assessment: LLE deficits/detail       Communication   Communication: No difficulties  Cognition Arousal/Alertness: Awake/alert Behavior During Therapy: WFL for tasks assessed/performed Overall Cognitive Status: Within Functional Limits for tasks assessed                                        General Comments      Exercises Total Joint Exercises Ankle Circles/Pumps: AROM;Both;15 reps;Supine   Assessment/Plan    PT Assessment Patient needs continued PT services  PT Problem  List Decreased strength;Decreased range of motion;Decreased activity tolerance;Decreased mobility;Decreased knowledge of use of DME;Pain       PT Treatment Interventions DME instruction;Gait training;Stair training;Functional mobility training;Therapeutic activities;Therapeutic exercise;Patient/family education    PT Goals (Current goals can be found in the Care Plan section)  Acute Rehab PT Goals Patient Stated Goal: Regain IND PT Goal Formulation: With patient Time For Goal Achievement: 12/31/18 Potential to  Achieve Goals: Good    Frequency 7X/week   Barriers to discharge        Co-evaluation               AM-PAC PT "6 Clicks" Mobility  Outcome Measure Help needed turning from your back to your side while in a flat bed without using bedrails?: A Little Help needed moving from lying on your back to sitting on the side of a flat bed without using bedrails?: A Little Help needed moving to and from a bed to a chair (including a wheelchair)?: A Little Help needed standing up from a chair using your arms (e.g., wheelchair or bedside chair)?: A Little Help needed to walk in hospital room?: A Little Help needed climbing 3-5 steps with a railing? : A Little 6 Click Score: 18    End of Session Equipment Utilized During Treatment: Gait belt Activity Tolerance: Patient tolerated treatment well Patient left: in chair;with call bell/phone within reach;with family/visitor present Nurse Communication: Mobility status PT Visit Diagnosis: Difficulty in walking, not elsewhere classified (R26.2)    Time: 8032-1224 PT Time Calculation (min) (ACUTE ONLY): 27 min   Charges:   PT Evaluation $PT Eval Low Complexity: 1 Low PT Treatments $Gait Training: 8-22 mins        [place] Pager [phone] Office [phone]   Rufus Beske 12/24/2018, 4:39 PM

## 2018-12-25 LAB — CBC
HCT: 31.9 % — ABNORMAL LOW (ref 39.0–52.0)
Hemoglobin: 10.4 g/dL — ABNORMAL LOW (ref 13.0–17.0)
MCH: 31.5 pg (ref 26.0–34.0)
MCHC: 32.6 g/dL (ref 30.0–36.0)
MCV: 96.7 fL (ref 80.0–100.0)
Platelets: 190 10*3/uL (ref 150–400)
RBC: 3.3 MIL/uL — ABNORMAL LOW (ref 4.22–5.81)
RDW: 13 % (ref 11.5–15.5)
WBC: 12.6 10*3/uL — ABNORMAL HIGH (ref 4.0–10.5)
nRBC: 0 % (ref 0.0–0.2)

## 2018-12-25 LAB — BASIC METABOLIC PANEL
Anion gap: 6 (ref 5–15)
BUN: 22 mg/dL (ref 8–23)
CO2: 27 mmol/L (ref 22–32)
Calcium: 8.3 mg/dL — ABNORMAL LOW (ref 8.9–10.3)
Chloride: 104 mmol/L (ref 98–111)
Creatinine, Ser: 0.53 mg/dL — ABNORMAL LOW (ref 0.61–1.24)
GFR calc Af Amer: >60 mL/min (ref >60)
GFR calc non Af Amer: >60 mL/min (ref >60)
Glucose, Bld: 118 mg/dL — ABNORMAL HIGH (ref 70–99)
Potassium: 4.8 mmol/L (ref 3.5–5.1)
Sodium: 137 mmol/L (ref 135–145)

## 2018-12-25 NOTE — Progress Notes (Signed)
Physical Therapy Treatment Patient Details Name: Joshua Carney MRN: [id number] DOB: [birth year] Today's Date: 12/25/2018    History of Present Illness Pt s/p L THR conversion from non-healing pinning.  Pt with hx of L TKR , nonischemic cardiomyopathy, and peripheral neuropathy    PT Comments    Pt motivated and progressing well with mobility.  Pt hopeful for dc home this pm   Follow Up Recommendations  Follow surgeon's recommendation for DC plan and follow-up therapies     Equipment Recommendations  None recommended by PT    Recommendations for Other Services       Precautions / Restrictions Precautions Precautions: Fall Restrictions Weight Bearing Restrictions: No Other Position/Activity Restrictions: WBAT    Mobility  Bed Mobility               General bed mobility comments: Pt up in chair and requests back to same  Transfers Overall transfer level: Needs assistance Equipment used: Rolling walker (2 wheeled) Transfers: Sit to/from Stand Sit to Stand: Min assist;Min guard         General transfer comment: cues for LE management and use of UE to self assist  Ambulation/Gait Ambulation/Gait assistance: Min assist;Min guard Gait Distance (Feet): 240 Feet Assistive device: Rolling walker (2 wheeled) Gait Pattern/deviations: Decreased step length - right;Decreased step length - left;Shuffle;Trunk flexed;Step-to pattern;Step-through pattern Gait velocity: decr   General Gait Details: cues for sequence, posture and position from [employer] Mobility    Modified Rankin (Stroke Patients Only)       Balance Overall balance assessment: Mild deficits observed, not formally tested                                          Cognition Arousal/Alertness: Awake/alert Behavior During Therapy: WFL for tasks assessed/performed Overall Cognitive Status: Within Functional Limits for tasks assessed                                         Exercises Total Joint Exercises Ankle Circles/Pumps: AROM;Both;15 reps;Supine Quad Sets: AROM;Both;10 reps;Supine Heel Slides: AAROM;Right;20 reps;Supine Hip ABduction/ADduction: AAROM;Right;15 reps;Supine Long Arc Quad: AROM;Right;10 reps;Seated    General Comments        Pertinent Vitals/Pain Pain Assessment: 0-10 Pain Score: 3  Pain Location: R hip Pain Descriptors / Indicators: Aching;Sore Pain Intervention(s): Limited activity within patient's tolerance;Monitored during session;Premedicated before session;Ice applied    Home Living                      Prior Function            PT Goals (current goals can now be found in the care plan section) Acute Rehab PT Goals Patient Stated Goal: Regain IND PT Goal Formulation: With patient Time For Goal Achievement: 12/31/18 Potential to Achieve Goals: Good Progress towards PT goals: Progressing toward goals    Frequency    7X/week      PT Plan Current plan remains appropriate    Co-evaluation              AM-PAC PT "6 Clicks" Mobility   Outcome Measure  Help needed turning from your back to your side while in a flat bed without  using bedrails?: A Little Help needed moving from lying on your back to sitting on the side of a flat bed without using bedrails?: A Little Help needed moving to and from a bed to a chair (including a wheelchair)?: A Little Help needed standing up from a chair using your arms (e.g., wheelchair or bedside chair)?: A Little Help needed to walk in hospital room?: A Little Help needed climbing 3-5 steps with a railing? : A Little 6 Click Score: 18    End of Session Equipment Utilized During Treatment: Gait belt Activity Tolerance: Patient tolerated treatment well Patient left: in chair;with call bell/phone within reach;with family/visitor present Nurse Communication: Mobility status PT Visit Diagnosis: Difficulty in walking, not  elsewhere classified (R26.2)     Time: 0940-1010 PT Time Calculation (min) (ACUTE ONLY): 30 min  Charges:  $Gait Training: 8-22 mins $Therapeutic Exercise: 8-22 mins                     [place] Pager [phone] Office [phone]    Trevin Gartrell 12/25/2018, 2:58 PM

## 2018-12-25 NOTE — Progress Notes (Signed)
Patient ID: Joshua Carney, male   DOB: [birth year], 84 y.o.   MRN: [id number] Subjective: 1 Day Post-Op Procedure(s) (LRB): CONVERSION TO RIGHT ANTERIOR TOTAL HIP (Right)    Patient reports pain as mild to moderate.  No events. Did well with therapy yesterday.  Reviewed intra-operative findings  Objective:   VITALS:   Vitals:   12/25/18 0151 12/25/18 0601  BP: 127/62 (!) 124/52  Pulse: 65 63  Resp: 18 16  Temp: (!) 97.3 F (36.3 C) (!) 97.5 F (36.4 C)  SpO2: 98% 98%    Neurovascular intact Incision: dressing C/D/I  LABS Recent Labs    12/25/18 0510  HGB 10.4*  HCT 31.9*  WBC 12.6*  PLT 190    Recent Labs    12/25/18 0510  NA 137  K 4.8  BUN 22  CREATININE 0.53*  GLUCOSE 118*    No results for input(s): LABPT, INR in the last 72 hours.   Assessment/Plan: 1 Day Post-Op Procedure(s) (LRB): CONVERSION TO RIGHT ANTERIOR TOTAL HIP (Right)   Advance diet Up with therapy  Home today after therapy RTC in 2 weeks

## 2018-12-25 NOTE — Plan of Care (Signed)
  Problem: Education: Goal: Knowledge of General Education information will improve Description Including pain rating scale, medication(s)/side effects and non-pharmacologic comfort measures Outcome: Progressing   Problem: Clinical Measurements: Goal: Respiratory complications will improve Outcome: Progressing   Problem: Coping: Goal: Level of anxiety will decrease Outcome: Progressing   Problem: Education: Goal: Understanding of discharge needs will improve Outcome: Progressing

## 2018-12-25 NOTE — Progress Notes (Signed)
Physical Therapy Treatment Patient Details Name: Joshua Carney MRN: [id number] DOB: [birth year] Today's Date: 12/25/2018    History of Present Illness Pt s/p L THR conversion from non-healing pinning.  Pt with hx of L TKR , nonischemic cardiomyopathy, and peripheral neuropathy    PT Comments    Pt motivated and progressing well with mobility.  Pt and spouse reviewed stairs, car transfers and home therex program with progression and written instruction provided.     Follow Up Recommendations  Follow surgeon's recommendation for DC plan and follow-up therapies     Equipment Recommendations  None recommended by PT    Recommendations for Other Services       Precautions / Restrictions Precautions Precautions: Fall Restrictions Weight Bearing Restrictions: No Other Position/Activity Restrictions: WBAT    Mobility  Bed Mobility               General bed mobility comments: Pt up in chair and requests back to same  Transfers Overall transfer level: Needs assistance Equipment used: Rolling walker (2 wheeled) Transfers: Sit to/from Stand Sit to Stand: Min guard;Supervision         General transfer comment: cues for LE management and use of UE to self assist  Ambulation/Gait Ambulation/Gait assistance: Min guard;Supervision Gait Distance (Feet): 150 Feet Assistive device: Rolling walker (2 wheeled) Gait Pattern/deviations: Decreased step length - right;Decreased step length - left;Shuffle;Trunk flexed;Step-to pattern;Step-through pattern Gait velocity: decr   General Gait Details: cues for sequence, posture and position from RW   Stairs Stairs: Yes Stairs assistance: Min assist Stair Management: No rails;One rail Right;Step to pattern;Forwards;With crutches;With walker Number of Stairs: 9 General stair comments: single step twice with RW; 7 stairs with rail and crutch; cues for sequence and foot/crutch/RW placement; written instruction provided; spouse and son  present   Wheelchair Mobility    Modified Rankin (Stroke Patients Only)       Balance Overall balance assessment: Mild deficits observed, not formally tested                                          Cognition Arousal/Alertness: Awake/alert Behavior During Therapy: WFL for tasks assessed/performed Overall Cognitive Status: Within Functional Limits for tasks assessed                                        Exercises Total Joint Exercises Ankle Circles/Pumps: AROM;Both;15 reps;Supine Quad Sets: AROM;Both;10 reps;Supine Heel Slides: AAROM;Right;20 reps;Supine Hip ABduction/ADduction: AAROM;Right;15 reps;Supine Long Arc Quad: AROM;Right;10 reps;Seated    General Comments        Pertinent Vitals/Pain Pain Assessment: 0-10 Pain Score: 3  Pain Location: R hip Pain Descriptors / Indicators: Aching;Sore Pain Intervention(s): Limited activity within patient's tolerance;Monitored during session;Premedicated before session;Ice applied    Home Living                      Prior Function            PT Goals (current goals can now be found in the care plan section) Acute Rehab PT Goals Patient Stated Goal: Regain IND PT Goal Formulation: With patient Time For Goal Achievement: 12/31/18 Potential to Achieve Goals: Good Progress towards PT goals: Progressing toward goals    Frequency    7X/week      PT  Plan Current plan remains appropriate    Co-evaluation              AM-PAC PT "6 Clicks" Mobility   Outcome Measure  Help needed turning from your back to your side while in a flat bed without using bedrails?: A Little Help needed moving from lying on your back to sitting on the side of a flat bed without using bedrails?: A Little Help needed moving to and from a bed to a chair (including a wheelchair)?: A Little Help needed standing up from a chair using your arms (e.g., wheelchair or bedside chair)?: A Little Help  needed to walk in hospital room?: A Little Help needed climbing 3-5 steps with a railing? : A Little 6 Click Score: 18    End of Session Equipment Utilized During Treatment: Gait belt Activity Tolerance: Patient tolerated treatment well Patient left: in chair;with call bell/phone within reach;with family/visitor present Nurse Communication: Mobility status PT Visit Diagnosis: Difficulty in walking, not elsewhere classified (R26.2)     Time: 1505-6979 PT Time Calculation (min) (ACUTE ONLY): 55 min  Charges:  $Gait Training: 8-22 mins $Therapeutic Exercise: 8-22 mins $Therapeutic Activity: 8-22 mins                     [place] Pager [phone] Office [phone]    Joshua Carney 12/25/2018, 3:13 PM

## 2018-12-28 ENCOUNTER — Encounter (HOSPITAL_COMMUNITY): Payer: Self-pay | Admitting: Orthopedic Surgery

## 2019-01-01 NOTE — Discharge Summary (Signed)
Physician Discharge Summary  Patient ID: Joshua Carney MRN: [id number] DOB/AGE: 02-Aug-1934 85 y.o.  Admit date: 12/24/2018 Discharge date: 12/25/2018   Procedures:  Procedure(s) (LRB): CONVERSION TO RIGHT ANTERIOR TOTAL HIP (Right)  Attending Physician:  Dr. Paralee Cancel   Admission Diagnoses:   Right hip failed previous surgery  Discharge Diagnoses:  Principal Problem:   S/P right THA, AA  Past Medical History:  Diagnosis Date  . Anticoagulant long-term use    Eliquis  . Benign localized prostatic hyperplasia with lower urinary tract symptoms (LUTS)   . Chronic systolic (congestive) heart failure ([place])    followed by dr hochrein  . Coronary artery disease    cardiologist-  dr hochrein--  per cardiac cath 01-21-2013  non-obstructive (LAD 25%,  Diagonal 25%,  RI 50%, AV groove 95%, pRCA 30%,  PDA ostial 30%,  ef 55%)   . Degenerative disc disease, cervical   . ED (erectile dysfunction)   . Hearing loss   . History of diabetes mellitus    hx of diabetes , per pt lost alot of weight no longer take medication and longer dx as diabetic  . History of kidney stones   . History of skin cancer   . Hypertension   . Hypothyroidism   . Insomnia   . Internal hemorrhoids   . Left bundle branch block   . Left cervical radiculopathy   . Mild atherosclerosis of carotid artery, bilateral    last duplex 06-02-2018,  bilateral ICA 1-39%  . Mixed hyperlipidemia   . Nonischemic cardiomyopathy ([place])    ( echo 11/ 2013 , ef 35-40%)  last echo 04-29-2017,  ef 50-55%  . Osteoarthritis    right knee  . PAF (paroxysmal atrial fibrillation) ([place]) 03/2017   cardiologist-- dr Percival Spanish  . Peripheral neuropathy   . Wears glasses     HPI:    Joshua Carney, 85 y.o. male, has a history of pain and functional disability in the right hip due to failure of previous percutaneous screw fixation and patient has failed non-surgical conservative treatments to include NSAID's and/or analgesics, use of  assistive devices and activity modification. The indications for the revision total hip arthroplasty are failure of previous hip surgery. Onset of symptoms was gradual starting 3 months ago with rapidlly worsening course since that time.  Prior procedures on the right hip include percutaneous screw fixation. Patient currently rates pain in the right hip at 8 out of 10 with activity.  There is worsening of pain with activity and weight bearing, trendelenberg gait, pain that interfers with activities of daily living and pain with passive range of motion. Patient has evidence of collapse of the femoral neck around the percutaneous screws by imaging studies.  This condition presents safety issues increasing the risk of falls.   There is no current active infection. Risks, benefits and expectations were discussed with the patient.  Risks including but not limited to the risk of anesthesia, blood clots, nerve damage, blood vessel damage, failure of the prosthesis, infection and up to and including death.  Patient understand the risks, benefits and expectations and wishes to proceed with surgery.   PCP: Derinda Late, MD   Discharged Condition: good  Hospital Course:  Patient underwent the above stated procedure on 12/24/2018. Patient tolerated the procedure well and brought to the recovery room in good condition and subsequently to the floor.  POD #1 BP: 124/52 ; Pulse: 63 ; Temp: 97.5 F (36.4 C) ; Resp: 16 Patient  reports pain as mild to moderate.  No events. Did well with therapy yesterday.  Reviewed intra-operative findings. Neurovascular intact and incision: dressing C/D/I.   LABS  Basename    HGB     10.4  HCT     31.9    Discharge Exam: General appearance: alert, cooperative and no distress Extremities: Homans sign is negative, no sign of DVT, no edema, redness or tenderness in the calves or thighs and no ulcers, gangrene or trophic changes  Disposition:  Home with follow up in 2  weeks   Follow-up Information    Paralee Cancel, MD. Schedule an appointment as soon as possible for a visit in 2 weeks.   Specialty:  Orthopedic Surgery Contact information: [address] [place] [address] [phone]           Discharge Instructions    Call MD / Call 911   Complete by:  As directed    If you experience chest pain or shortness of breath, CALL 911 and be transported to the hospital emergency room.  If you develope a fever above 101 F, pus (white drainage) or increased drainage or redness at the wound, or calf pain, call your surgeon's office.   Change dressing   Complete by:  As directed    Maintain surgical dressing until follow up in the clinic. If the edges start to pull up, may reinforce with tape. If the dressing is no longer working, may remove and cover with gauze and tape, but must keep the area dry and clean.  Call with any questions or concerns.   Constipation Prevention   Complete by:  As directed    Drink plenty of fluids.  Prune juice may be helpful.  You may use a stool softener, such as Colace (over the counter) 100 mg twice a day.  Use MiraLax (over the counter) for constipation as needed.   Diet - low sodium heart healthy   Complete by:  As directed    Discharge instructions   Complete by:  As directed    Maintain surgical dressing until follow up in the clinic. If the edges start to pull up, may reinforce with tape. If the dressing is no longer working, may remove and cover with gauze and tape, but must keep the area dry and clean.  [facility]. Call with any questions or concerns.   Increase activity slowly as tolerated   Complete by:  As directed    Weight bearing as tolerated with assist device (walker, cane, etc) as directed, use it as long as suggested by your surgeon or therapist, typically at least 4-6 weeks.   TED hose   Complete by:  As directed    Use stockings (TED hose) for 2  weeks on both leg(s).  You may remove them at night for sleeping.      Allergies as of 12/25/2018   No Known Allergies     Medication List    STOP taking these medications   metoprolol succinate 50 MG 24 hr tablet Commonly known as:  TOPROL XL     TAKE these medications   ALPRAZolam 0.5 MG tablet Commonly known as:  XANAX Take 0.5 mg by mouth at bedtime.   amLODipine 5 MG tablet Commonly known as:  NORVASC Take 5 mg by mouth every morning.   B COMPLEX 100 PO Take 100 mg by mouth daily.   docusate sodium 100 MG capsule Commonly known as:  COLACE Take 1 capsule (100 mg total) by mouth 2 (two) times daily.   ELIQUIS 5 MG Tabs tablet Generic drug:  apixaban Take 1 tablet (5 mg total) by mouth 2 (two) times daily.   EQL COQ10 300 MG Caps Generic drug:  Coenzyme Q10 Take 300 mg by mouth daily.   Evening Primrose Oil 1000 MG Caps Take 1,000 mg by mouth daily.   ferrous sulfate 325 (65 FE) MG tablet Commonly known as:  FERROUSUL Take 1 tablet (325 mg total) by mouth 3 (three) times daily with meals.   HYDROcodone-acetaminophen 7.5-325 MG tablet Commonly known as:  NORCO Take 1-2 tablets by mouth every 4 (four) hours as needed for moderate pain. What changed:  Another medication with the same name was added. Make sure you understand how and when to take each.   HYDROcodone-acetaminophen 7.5-325 MG tablet Commonly known as:  NORCO Take 1-2 tablets by mouth every 4 (four) hours as needed for moderate pain. What changed:  You were already taking a medication with the same name, and this prescription was added. Make sure you understand how and when to take each.   hydroxypropyl methylcellulose / hypromellose 2.5 % ophthalmic solution Commonly known as:  ISOPTO TEARS / GONIOVISC Place 1 drop into both eyes daily as needed for dry eyes.   levothyroxine 75 MCG tablet Commonly known as:  SYNTHROID, LEVOTHROID Take 37.5 mcg by mouth See admin instructions. Take 37.5 mcg by  mouth daily in the evening except do NOT take on Sunday   losartan 100 MG tablet Commonly known as:  COZAAR Take 100 mg by mouth daily.   MAGNESIUM-OXIDE PO Take 600 mg by mouth daily.   Melatonin 3 MG Tabs Take 3 mg by mouth at bedtime.   methocarbamol 500 MG tablet Commonly known as:  ROBAXIN Take 1 tablet (500 mg total) by mouth every 6 (six) hours as needed for muscle spasms.   nitroGLYCERIN 0.4 MG SL tablet Commonly known as:  NITROSTAT PLACE 1 TABLET UNDER THE TONGUE EVERY 5 MINUTES AS NEEDED FOR CHEST PAIN What changed:  See the new instructions.   polyethylene glycol packet Commonly known as:  MIRALAX / GLYCOLAX Take 17 g by mouth 2 (two) times daily. What changed:  when to take this   PRESERVISION AREDS 2 PO Take 1 capsule by mouth 2 (two) times daily.   tamsulosin 0.4 MG Caps capsule Commonly known as:  FLOMAX Take 0.4 mg by mouth daily.   traZODone 50 MG tablet Commonly known as:  DESYREL Take 50 mg by mouth at bedtime.   Vitamin D3 50 MCG (2000 UT) Tabs Take 2,000 Units by mouth daily.            Discharge Care Instructions  (From admission, onward)         Start     Ordered   12/25/18 0000  Change dressing    Comments:  Maintain surgical dressing until follow up in the clinic. If the edges start to pull up, may reinforce with tape. If the dressing is no longer working, may remove and cover with gauze and tape, but must keep the area dry and clean.  Call with any questions or concerns.   12/25/18 0746           Signed: [place] Pugh. Ashawn Rinehart   PA-C  01/01/2019, 8:03 AM

## 2019-03-02 ENCOUNTER — Other Ambulatory Visit: Payer: Self-pay | Admitting: Cardiology

## 2019-07-28 ENCOUNTER — Encounter (HOSPITAL_COMMUNITY): Payer: Self-pay | Admitting: Emergency Medicine

## 2019-07-28 ENCOUNTER — Inpatient Hospital Stay (HOSPITAL_COMMUNITY)
Admission: EM | Admit: 2019-07-28 | Discharge: 2019-08-03 | DRG: 470 | Disposition: A | Discharge status: Home Health | Payer: Medicare Other | Attending: Internal Medicine | Admitting: Internal Medicine

## 2019-07-28 ENCOUNTER — Emergency Department (HOSPITAL_COMMUNITY): Payer: Medicare Other

## 2019-07-28 ENCOUNTER — Other Ambulatory Visit: Payer: Self-pay

## 2019-07-28 DIAGNOSIS — I2510 Atherosclerotic heart disease of native coronary artery without angina pectoris: Secondary | ICD-10-CM | POA: Diagnosis present

## 2019-07-28 DIAGNOSIS — E871 Hypo-osmolality and hyponatremia: Secondary | ICD-10-CM | POA: Diagnosis not present

## 2019-07-28 DIAGNOSIS — I5022 Chronic systolic (congestive) heart failure: Secondary | ICD-10-CM | POA: Diagnosis present

## 2019-07-28 DIAGNOSIS — Z96641 Presence of right artificial hip joint: Secondary | ICD-10-CM | POA: Diagnosis present

## 2019-07-28 DIAGNOSIS — Z79891 Long term (current) use of opiate analgesic: Secondary | ICD-10-CM

## 2019-07-28 DIAGNOSIS — I482 Chronic atrial fibrillation, unspecified: Secondary | ICD-10-CM | POA: Diagnosis present

## 2019-07-28 DIAGNOSIS — E039 Hypothyroidism, unspecified: Secondary | ICD-10-CM | POA: Diagnosis present

## 2019-07-28 DIAGNOSIS — I110 Hypertensive heart disease with heart failure: Secondary | ICD-10-CM | POA: Diagnosis present

## 2019-07-28 DIAGNOSIS — S72002A Fracture of unspecified part of neck of left femur, initial encounter for closed fracture: Secondary | ICD-10-CM

## 2019-07-28 DIAGNOSIS — E782 Mixed hyperlipidemia: Secondary | ICD-10-CM | POA: Diagnosis present

## 2019-07-28 DIAGNOSIS — I428 Other cardiomyopathies: Secondary | ICD-10-CM | POA: Diagnosis present

## 2019-07-28 DIAGNOSIS — Z85828 Personal history of other malignant neoplasm of skin: Secondary | ICD-10-CM

## 2019-07-28 DIAGNOSIS — Z23 Encounter for immunization: Secondary | ICD-10-CM | POA: Diagnosis present

## 2019-07-28 DIAGNOSIS — Z87442 Personal history of urinary calculi: Secondary | ICD-10-CM | POA: Diagnosis not present

## 2019-07-28 DIAGNOSIS — Z7901 Long term (current) use of anticoagulants: Secondary | ICD-10-CM

## 2019-07-28 DIAGNOSIS — N401 Enlarged prostate with lower urinary tract symptoms: Secondary | ICD-10-CM | POA: Diagnosis not present

## 2019-07-28 DIAGNOSIS — N400 Enlarged prostate without lower urinary tract symptoms: Secondary | ICD-10-CM | POA: Diagnosis present

## 2019-07-28 DIAGNOSIS — I10 Essential (primary) hypertension: Secondary | ICD-10-CM | POA: Diagnosis not present

## 2019-07-28 DIAGNOSIS — I4891 Unspecified atrial fibrillation: Secondary | ICD-10-CM | POA: Diagnosis not present

## 2019-07-28 DIAGNOSIS — D72829 Elevated white blood cell count, unspecified: Secondary | ICD-10-CM | POA: Diagnosis present

## 2019-07-28 DIAGNOSIS — Z96652 Presence of left artificial knee joint: Secondary | ICD-10-CM | POA: Diagnosis present

## 2019-07-28 DIAGNOSIS — Z79899 Other long term (current) drug therapy: Secondary | ICD-10-CM | POA: Diagnosis not present

## 2019-07-28 DIAGNOSIS — S72142A Displaced intertrochanteric fracture of left femur, initial encounter for closed fracture: Secondary | ICD-10-CM

## 2019-07-28 DIAGNOSIS — Z96649 Presence of unspecified artificial hip joint: Secondary | ICD-10-CM

## 2019-07-28 DIAGNOSIS — Z8249 Family history of ischemic heart disease and other diseases of the circulatory system: Secondary | ICD-10-CM

## 2019-07-28 DIAGNOSIS — Z87891 Personal history of nicotine dependence: Secondary | ICD-10-CM | POA: Diagnosis not present

## 2019-07-28 DIAGNOSIS — Y9355 Activity, bike riding: Secondary | ICD-10-CM | POA: Diagnosis not present

## 2019-07-28 DIAGNOSIS — Z7289 Other problems related to lifestyle: Secondary | ICD-10-CM

## 2019-07-28 DIAGNOSIS — Z419 Encounter for procedure for purposes other than remedying health state, unspecified: Secondary | ICD-10-CM

## 2019-07-28 DIAGNOSIS — E1142 Type 2 diabetes mellitus with diabetic polyneuropathy: Secondary | ICD-10-CM | POA: Diagnosis present

## 2019-07-28 DIAGNOSIS — S7292XA Unspecified fracture of left femur, initial encounter for closed fracture: Secondary | ICD-10-CM | POA: Diagnosis present

## 2019-07-28 DIAGNOSIS — G4700 Insomnia, unspecified: Secondary | ICD-10-CM | POA: Diagnosis present

## 2019-07-28 DIAGNOSIS — Z79890 Hormone replacement therapy (postmenopausal): Secondary | ICD-10-CM

## 2019-07-28 DIAGNOSIS — S72032A Displaced midcervical fracture of left femur, initial encounter for closed fracture: Principal | ICD-10-CM | POA: Diagnosis present

## 2019-07-28 DIAGNOSIS — Z20828 Contact with and (suspected) exposure to other viral communicable diseases: Secondary | ICD-10-CM | POA: Diagnosis present

## 2019-07-28 DIAGNOSIS — D62 Acute posthemorrhagic anemia: Secondary | ICD-10-CM | POA: Diagnosis not present

## 2019-07-28 DIAGNOSIS — S72142D Displaced intertrochanteric fracture of left femur, subsequent encounter for closed fracture with routine healing: Secondary | ICD-10-CM | POA: Diagnosis not present

## 2019-07-28 LAB — CBC
HCT: 43.5 % (ref 39.0–52.0)
Hemoglobin: 14.2 g/dL (ref 13.0–17.0)
MCH: 31.1 pg (ref 26.0–34.0)
MCHC: 32.6 g/dL (ref 30.0–36.0)
MCV: 95.2 fL (ref 80.0–100.0)
Platelets: 219 10*3/uL (ref 150–400)
RBC: 4.57 MIL/uL (ref 4.22–5.81)
RDW: 13.2 % (ref 11.5–15.5)
WBC: 7.7 10*3/uL (ref 4.0–10.5)
nRBC: 0 % (ref 0.0–0.2)

## 2019-07-28 LAB — BASIC METABOLIC PANEL
Anion gap: 9 (ref 5–15)
BUN: 18 mg/dL (ref 8–23)
CO2: 26 mmol/L (ref 22–32)
Calcium: 8.7 mg/dL — ABNORMAL LOW (ref 8.9–10.3)
Chloride: 100 mmol/L (ref 98–111)
Creatinine, Ser: 0.51 mg/dL — ABNORMAL LOW (ref 0.61–1.24)
GFR calc Af Amer: >60 mL/min (ref >60)
GFR calc non Af Amer: >60 mL/min (ref >60)
Glucose, Bld: 127 mg/dL — ABNORMAL HIGH (ref 70–99)
Potassium: 4.3 mmol/L (ref 3.5–5.1)
Sodium: 135 mmol/L (ref 135–145)

## 2019-07-28 LAB — TYPE AND SCREEN
ABO/RH(D): A POS
Antibody Screen: NEGATIVE

## 2019-07-28 LAB — SARS CORONAVIRUS 2 BY RT PCR (HOSPITAL ORDER, PERFORMED IN ~~LOC~~ HOSPITAL LAB): SARS Coronavirus 2: NEGATIVE

## 2019-07-28 MED ORDER — LEVOTHYROXINE SODIUM 75 MCG PO TABS
37.5000 ug | ORAL_TABLET | ORAL | Status: DC
  Administered 2019-07-29 – 2019-08-03 (×4): 37.5 ug via ORAL
  Filled 2019-07-28: qty 1
  Filled 2019-07-28: qty 0.5
  Filled 2019-07-28 (×2): qty 1

## 2019-07-28 MED ORDER — POLYETHYLENE GLYCOL 3350 17 G PO PACK
17.0000 g | PACK | Freq: Every day | ORAL | Status: DC | PRN
  Administered 2019-07-29: 18:00:00 17 g via ORAL
  Filled 2019-07-28 (×2): qty 1

## 2019-07-28 MED ORDER — MAGNESIUM OXIDE 400 (241.3 MG) MG PO TABS
600.0000 mg | ORAL_TABLET | Freq: Every day | ORAL | Status: DC
  Administered 2019-07-29 – 2019-08-03 (×6): 600 mg via ORAL
  Filled 2019-07-28 (×6): qty 2

## 2019-07-28 MED ORDER — MORPHINE SULFATE (PF) 2 MG/ML IV SOLN
2.0000 mg | INTRAVENOUS | Status: DC | PRN
  Administered 2019-07-28 – 2019-07-29 (×6): 2 mg via INTRAVENOUS
  Filled 2019-07-28 (×6): qty 1

## 2019-07-28 MED ORDER — MORPHINE SULFATE (PF) 4 MG/ML IV SOLN
4.0000 mg | Freq: Once | INTRAVENOUS | Status: AC
  Administered 2019-07-28: 16:00:00 4 mg via INTRAVENOUS
  Filled 2019-07-28: qty 1

## 2019-07-28 MED ORDER — HYDROMORPHONE HCL 1 MG/ML IJ SOLN
1.0000 mg | Freq: Once | INTRAMUSCULAR | Status: AC
  Administered 2019-07-28: 1 mg via INTRAVENOUS
  Filled 2019-07-28: qty 1

## 2019-07-28 MED ORDER — EVENING PRIMROSE OIL 1000 MG PO CAPS: 1000.0000 mg | ORAL_CAPSULE | Freq: Every day | ORAL | Status: DC

## 2019-07-28 MED ORDER — TETANUS-DIPHTH-ACELL PERTUSSIS 5-2.5-18.5 LF-MCG/0.5 IM SUSP
0.5000 mL | Freq: Once | INTRAMUSCULAR | Status: AC
  Administered 2019-07-28: 16:00:00 0.5 mL via INTRAMUSCULAR
  Filled 2019-07-28: qty 0.5

## 2019-07-28 MED ORDER — COENZYME Q10 300 MG PO CAPS: 300.0000 mg | ORAL_CAPSULE | Freq: Every day | ORAL | Status: DC

## 2019-07-28 MED ORDER — AMLODIPINE BESYLATE 5 MG PO TABS
5.0000 mg | ORAL_TABLET | Freq: Every morning | ORAL | Status: DC
  Administered 2019-07-29 – 2019-08-03 (×6): 5 mg via ORAL
  Filled 2019-07-28 (×6): qty 1

## 2019-07-28 MED ORDER — POLYVINYL ALCOHOL 1.4 % OP SOLN
1.0000 [drp] | Freq: Every day | OPHTHALMIC | Status: DC | PRN
  Filled 2019-07-28: qty 15

## 2019-07-28 MED ORDER — METHOCARBAMOL 500 MG PO TABS
500.0000 mg | ORAL_TABLET | Freq: Four times a day (QID) | ORAL | Status: DC | PRN
  Administered 2019-07-29: 10:00:00 500 mg via ORAL
  Filled 2019-07-28: qty 1

## 2019-07-28 MED ORDER — ALPRAZOLAM 0.5 MG PO TABS
0.5000 mg | ORAL_TABLET | Freq: Every day | ORAL | Status: DC
  Administered 2019-07-28 – 2019-08-02 (×6): 0.5 mg via ORAL
  Filled 2019-07-28 (×6): qty 1

## 2019-07-28 MED ORDER — KETOROLAC TROMETHAMINE 15 MG/ML IJ SOLN
15.0000 mg | Freq: Four times a day (QID) | INTRAMUSCULAR | Status: AC | PRN
  Administered 2019-07-29: 16:00:00 15 mg via INTRAVENOUS
  Filled 2019-07-28: qty 1

## 2019-07-28 MED ORDER — LOSARTAN POTASSIUM 50 MG PO TABS
100.0000 mg | ORAL_TABLET | Freq: Every day | ORAL | Status: DC
  Administered 2019-07-29 – 2019-08-03 (×6): 100 mg via ORAL
  Filled 2019-07-28 (×6): qty 2

## 2019-07-28 MED ORDER — MELATONIN 3 MG PO TABS
3.0000 mg | ORAL_TABLET | Freq: Every day | ORAL | Status: DC
  Administered 2019-07-28 – 2019-08-02 (×6): 3 mg via ORAL
  Filled 2019-07-28 (×8): qty 1

## 2019-07-28 MED ORDER — SODIUM CHLORIDE 0.9 % IV SOLN
INTRAVENOUS | Status: DC
  Administered 2019-07-28: 16:00:00 via INTRAVENOUS

## 2019-07-28 MED ORDER — VITAMIN D 25 MCG (1000 UNIT) PO TABS
2000.0000 [IU] | ORAL_TABLET | Freq: Every day | ORAL | Status: DC
  Administered 2019-07-29 – 2019-08-03 (×6): 2000 [IU] via ORAL
  Filled 2019-07-28 (×6): qty 2

## 2019-07-28 MED ORDER — TRAZODONE HCL 50 MG PO TABS
50.0000 mg | ORAL_TABLET | Freq: Every day | ORAL | Status: DC
  Administered 2019-07-28 – 2019-07-29 (×2): 50 mg via ORAL
  Filled 2019-07-28 (×2): qty 1

## 2019-07-28 MED ORDER — ONDANSETRON HCL 4 MG/2ML IJ SOLN
4.0000 mg | Freq: Once | INTRAMUSCULAR | Status: AC
  Administered 2019-07-28: 16:00:00 4 mg via INTRAVENOUS
  Filled 2019-07-28: qty 2

## 2019-07-28 MED ORDER — ONDANSETRON HCL 4 MG/2ML IJ SOLN: 4.0000 mg | Freq: Four times a day (QID) | INTRAMUSCULAR | Status: DC | PRN

## 2019-07-28 MED ORDER — SODIUM CHLORIDE 0.9 % IV SOLN
INTRAVENOUS | Status: DC
  Administered 2019-07-28 – 2019-07-30 (×3): via INTRAVENOUS

## 2019-07-28 MED ORDER — NITROGLYCERIN 0.4 MG SL SUBL: 0.4000 mg | SUBLINGUAL_TABLET | SUBLINGUAL | Status: DC | PRN

## 2019-07-28 MED ORDER — ONDANSETRON HCL 4 MG PO TABS: 4.0000 mg | ORAL_TABLET | Freq: Four times a day (QID) | ORAL | Status: DC | PRN
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
## 2019-07-28 NOTE — ED Notes (Signed)
Telfa applied to skin tear on left arm.

## 2019-07-28 NOTE — ED Notes (Signed)
Hospitalist at bedside 

## 2019-07-28 NOTE — ED Notes (Signed)
Patient given tomato soup, milk, and ice cream.
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
## 2019-07-28 NOTE — ED Notes (Signed)
Patient transported to X-ray 

## 2019-07-28 NOTE — ED Notes (Signed)
Patient's wife at bedside.

## 2019-07-29 ENCOUNTER — Encounter (HOSPITAL_COMMUNITY): Payer: Self-pay

## 2019-07-29 DIAGNOSIS — S72142D Displaced intertrochanteric fracture of left femur, subsequent encounter for closed fracture with routine healing: Secondary | ICD-10-CM

## 2019-07-29 LAB — GLUCOSE, CAPILLARY
Glucose-Capillary: 106 mg/dL — ABNORMAL HIGH (ref 70–99)
Glucose-Capillary: 105 mg/dL — ABNORMAL HIGH (ref 70–99)

## 2019-07-29 LAB — HEPARIN LEVEL (UNFRACTIONATED): Heparin Unfractionated: <0.1 IU/mL — ABNORMAL LOW (ref 0.30–0.70)

## 2019-07-29 LAB — CBC
HCT: 43 % (ref 39.0–52.0)
Hemoglobin: 14.3 g/dL (ref 13.0–17.0)
MCH: 31.3 pg (ref 26.0–34.0)
MCHC: 33.3 g/dL (ref 30.0–36.0)
MCV: 94.1 fL (ref 80.0–100.0)
Platelets: 181 10*3/uL (ref 150–400)
RBC: 4.57 MIL/uL (ref 4.22–5.81)
RDW: 13 % (ref 11.5–15.5)
WBC: 11.9 10*3/uL — ABNORMAL HIGH (ref 4.0–10.5)
nRBC: 0 % (ref 0.0–0.2)

## 2019-07-29 LAB — COMPREHENSIVE METABOLIC PANEL
ALT: 20 U/L (ref 0–44)
AST: 32 U/L (ref 15–41)
Albumin: 3.3 g/dL — ABNORMAL LOW (ref 3.5–5.0)
Alkaline Phosphatase: 122 U/L (ref 38–126)
Anion gap: 6 (ref 5–15)
BUN: 11 mg/dL (ref 8–23)
CO2: 24 mmol/L (ref 22–32)
Calcium: 8.5 mg/dL — ABNORMAL LOW (ref 8.9–10.3)
Chloride: 105 mmol/L (ref 98–111)
Creatinine, Ser: 0.34 mg/dL — ABNORMAL LOW (ref 0.61–1.24)
GFR calc Af Amer: >60 mL/min (ref >60)
GFR calc non Af Amer: >60 mL/min (ref >60)
Glucose, Bld: 110 mg/dL — ABNORMAL HIGH (ref 70–99)
Potassium: 4.2 mmol/L (ref 3.5–5.1)
Sodium: 135 mmol/L (ref 135–145)
Total Bilirubin: 0.8 mg/dL (ref 0.3–1.2)
Total Protein: 6.3 g/dL — ABNORMAL LOW (ref 6.5–8.1)

## 2019-07-29 LAB — HEMOGLOBIN A1C
Hgb A1c MFr Bld: 4.9 % (ref 4.8–5.6)
Mean Plasma Glucose: 93.93 mg/dL

## 2019-07-29 LAB — CBG MONITORING, ED: Glucose-Capillary: 96 mg/dL (ref 70–99)

## 2019-07-29 LAB — PROTIME-INR
INR: 1 (ref 0.8–1.2)
Prothrombin Time: 12.7 seconds (ref 11.4–15.2)

## 2019-07-29 LAB — APTT: aPTT: 39 seconds — ABNORMAL HIGH (ref 24–36)

## 2019-07-29 MED ORDER — INSULIN ASPART 100 UNIT/ML ~~LOC~~ SOLN
0.0000 [IU] | Freq: Three times a day (TID) | SUBCUTANEOUS | Status: DC
  Administered 2019-07-31: 08:00:00 1 [IU] via SUBCUTANEOUS
  Administered 2019-07-31: 12:00:00 2 [IU] via SUBCUTANEOUS
  Administered 2019-07-31: 1 [IU] via SUBCUTANEOUS
  Filled 2019-07-29: qty 0.09

## 2019-07-29 MED ORDER — CEFAZOLIN SODIUM-DEXTROSE 2-4 GM/100ML-% IV SOLN
2.0000 g | INTRAVENOUS | Status: AC
  Administered 2019-07-30: 16:00:00 2 g via INTRAVENOUS
  Filled 2019-07-29 (×2): qty 100

## 2019-07-29 MED ORDER — TRANEXAMIC ACID-NACL 1000-0.7 MG/100ML-% IV SOLN
1000.0000 mg | INTRAVENOUS | Status: AC
  Administered 2019-07-30: 16:00:00 1000 mg via INTRAVENOUS
  Filled 2019-07-29 (×2): qty 100

## 2019-07-29 MED ORDER — CHLORHEXIDINE GLUCONATE 4 % EX LIQD
60.0000 mL | Freq: Once | CUTANEOUS | Status: AC
  Administered 2019-07-30: 4 via TOPICAL
  Filled 2019-07-29: qty 60

## 2019-07-29 MED ORDER — HYDRALAZINE HCL 20 MG/ML IJ SOLN: 5.0000 mg | Freq: Four times a day (QID) | INTRAMUSCULAR | Status: DC | PRN

## 2019-07-29 MED ORDER — POVIDONE-IODINE 10 % EX SWAB: 2.0000 "application " | Freq: Once | CUTANEOUS | Status: DC

## 2019-07-29 MED ORDER — HEPARIN (PORCINE) 25000 UT/250ML-% IV SOLN
1250.0000 [IU]/h | INTRAVENOUS | Status: DC
  Administered 2019-07-29: 17:00:00 1250 [IU]/h via INTRAVENOUS
  Filled 2019-07-29 (×2): qty 250

## 2019-07-29 MED ORDER — METHOCARBAMOL 1000 MG/10ML IJ SOLN
500.0000 mg | Freq: Three times a day (TID) | INTRAVENOUS | Status: DC | PRN
  Administered 2019-08-01 – 2019-08-02 (×2): 500 mg via INTRAVENOUS
  Filled 2019-07-29 (×2): qty 5
  Filled 2019-07-29 (×2): qty 500
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
## 2019-07-29 NOTE — ED Notes (Signed)
Report given to Maggie, RN

## 2019-07-29 NOTE — ED Notes (Signed)
Pt provided with breakfast tray.
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
## 2019-07-30 ENCOUNTER — Encounter (HOSPITAL_COMMUNITY): Payer: Self-pay | Admitting: Anesthesiology

## 2019-07-30 ENCOUNTER — Encounter (HOSPITAL_COMMUNITY)
Admission: EM | Disposition: A | Discharge status: Home Health | Payer: Self-pay | Source: Home / Self Care | Attending: Internal Medicine

## 2019-07-30 ENCOUNTER — Inpatient Hospital Stay (HOSPITAL_COMMUNITY): Payer: Medicare Other | Admitting: Certified Registered"

## 2019-07-30 ENCOUNTER — Inpatient Hospital Stay (HOSPITAL_COMMUNITY): Payer: Medicare Other

## 2019-07-30 HISTORY — PX: TOTAL HIP ARTHROPLASTY: SHX124

## 2019-07-30 LAB — GLUCOSE, CAPILLARY
Glucose-Capillary: 86 mg/dL (ref 70–99)
Glucose-Capillary: 82 mg/dL (ref 70–99)
Glucose-Capillary: 83 mg/dL (ref 70–99)
Glucose-Capillary: 160 mg/dL — ABNORMAL HIGH (ref 70–99)

## 2019-07-30 LAB — CBC
HCT: 44.1 % (ref 39.0–52.0)
Hemoglobin: 15 g/dL (ref 13.0–17.0)
MCH: 31.7 pg (ref 26.0–34.0)
MCHC: 34 g/dL (ref 30.0–36.0)
MCV: 93.2 fL (ref 80.0–100.0)
Platelets: 169 10*3/uL (ref 150–400)
RBC: 4.73 MIL/uL (ref 4.22–5.81)
RDW: 13 % (ref 11.5–15.5)
WBC: 12.4 10*3/uL — ABNORMAL HIGH (ref 4.0–10.5)
nRBC: 0 % (ref 0.0–0.2)

## 2019-07-30 LAB — HEPARIN LEVEL (UNFRACTIONATED): Heparin Unfractionated: 0.36 IU/mL (ref 0.30–0.70)

## 2019-07-30 SURGERY — ARTHROPLASTY, HIP, TOTAL, ANTERIOR APPROACH
Anesthesia: General | Site: Hip | Laterality: Left

## 2019-07-30 MED ORDER — MORPHINE SULFATE (PF) 2 MG/ML IV SOLN: 0.5000 mg | INTRAVENOUS | Status: DC | PRN

## 2019-07-30 MED ORDER — ONDANSETRON HCL 4 MG/2ML IJ SOLN
INTRAMUSCULAR | Status: AC
  Filled 2019-07-30: qty 2

## 2019-07-30 MED ORDER — HYDROCODONE-ACETAMINOPHEN 7.5-325 MG PO TABS: 1.0000 | ORAL_TABLET | ORAL | Status: DC | PRN

## 2019-07-30 MED ORDER — ACETAMINOPHEN 325 MG PO TABS: 325.0000 mg | ORAL_TABLET | Freq: Four times a day (QID) | ORAL | Status: DC | PRN

## 2019-07-30 MED ORDER — HYDROMORPHONE HCL 1 MG/ML IJ SOLN: 0.2500 mg | INTRAMUSCULAR | Status: DC | PRN

## 2019-07-30 MED ORDER — MEPERIDINE HCL 50 MG/ML IJ SOLN: 6.2500 mg | INTRAMUSCULAR | Status: DC | PRN

## 2019-07-30 MED ORDER — MENTHOL 3 MG MT LOZG
1.0000 | LOZENGE | OROMUCOSAL | Status: DC | PRN
  Administered 2019-08-01: 3 mg via ORAL
  Filled 2019-07-30: qty 9

## 2019-07-30 MED ORDER — SODIUM CHLORIDE 0.9 % IV SOLN: INTRAVENOUS | Status: DC

## 2019-07-30 MED ORDER — FENTANYL CITRATE (PF) 100 MCG/2ML IJ SOLN
INTRAMUSCULAR | Status: AC
  Filled 2019-07-30: qty 2

## 2019-07-30 MED ORDER — HYDROCODONE-ACETAMINOPHEN 7.5-325 MG PO TABS: 1.0000 | ORAL_TABLET | ORAL | 0 refills | Status: DC | PRN

## 2019-07-30 MED ORDER — METOCLOPRAMIDE HCL 5 MG PO TABS: 5.0000 mg | ORAL_TABLET | Freq: Three times a day (TID) | ORAL | Status: DC | PRN

## 2019-07-30 MED ORDER — POLYETHYLENE GLYCOL 3350 17 G PO PACK
17.0000 g | PACK | Freq: Two times a day (BID) | ORAL | Status: DC
  Administered 2019-07-30 – 2019-08-03 (×7): 17 g via ORAL
  Filled 2019-07-30 (×8): qty 1

## 2019-07-30 MED ORDER — SUGAMMADEX SODIUM 200 MG/2ML IV SOLN
INTRAVENOUS | Status: DC | PRN
  Administered 2019-07-30: 170 mg via INTRAVENOUS

## 2019-07-30 MED ORDER — LIDOCAINE 2% (20 MG/ML) 5 ML SYRINGE
INTRAMUSCULAR | Status: AC
  Filled 2019-07-30: qty 5

## 2019-07-30 MED ORDER — PROPOFOL 10 MG/ML IV BOLUS
INTRAVENOUS | Status: DC | PRN
  Administered 2019-07-30: 100 mg via INTRAVENOUS

## 2019-07-30 MED ORDER — DOCUSATE SODIUM 100 MG PO CAPS: 100.0000 mg | ORAL_CAPSULE | Freq: Two times a day (BID) | ORAL | 0 refills | Status: AC

## 2019-07-30 MED ORDER — POLYETHYLENE GLYCOL 3350 17 G PO PACK: 17.0000 g | PACK | Freq: Two times a day (BID) | ORAL | 0 refills | Status: AC

## 2019-07-30 MED ORDER — ALUM & MAG HYDROXIDE-SIMETH 200-200-20 MG/5ML PO SUSP: 15.0000 mL | ORAL | Status: DC | PRN

## 2019-07-30 MED ORDER — BISACODYL 10 MG RE SUPP: 10.0000 mg | Freq: Every day | RECTAL | Status: DC | PRN

## 2019-07-30 MED ORDER — PHENYLEPHRINE HCL (PRESSORS) 10 MG/ML IV SOLN
INTRAVENOUS | Status: AC
  Filled 2019-07-30: qty 1

## 2019-07-30 MED ORDER — METOCLOPRAMIDE HCL 5 MG/ML IJ SOLN: 5.0000 mg | Freq: Three times a day (TID) | INTRAMUSCULAR | Status: DC | PRN

## 2019-07-30 MED ORDER — SODIUM CHLORIDE 0.9 % IV SOLN
INTRAVENOUS | Status: DC | PRN
  Administered 2019-07-30: 16:00:00 40 ug/min via INTRAVENOUS

## 2019-07-30 MED ORDER — MAGNESIUM CITRATE PO SOLN: 1.0000 | Freq: Once | ORAL | Status: DC | PRN

## 2019-07-30 MED ORDER — ONDANSETRON HCL 4 MG/2ML IJ SOLN: 4.0000 mg | Freq: Once | INTRAMUSCULAR | Status: DC | PRN

## 2019-07-30 MED ORDER — CEFAZOLIN SODIUM-DEXTROSE 2-4 GM/100ML-% IV SOLN
2.0000 g | Freq: Four times a day (QID) | INTRAVENOUS | Status: AC
  Administered 2019-07-30 – 2019-07-31 (×2): 2 g via INTRAVENOUS
  Filled 2019-07-30 (×2): qty 100

## 2019-07-30 MED ORDER — LIDOCAINE 2% (20 MG/ML) 5 ML SYRINGE
INTRAMUSCULAR | Status: DC | PRN
  Administered 2019-07-30: 60 mg via INTRAVENOUS

## 2019-07-30 MED ORDER — EPHEDRINE SULFATE-NACL 50-0.9 MG/10ML-% IV SOSY
PREFILLED_SYRINGE | INTRAVENOUS | Status: DC | PRN
  Administered 2019-07-30 (×2): 10 mg via INTRAVENOUS

## 2019-07-30 MED ORDER — LACTATED RINGERS IV SOLN
INTRAVENOUS | Status: DC
  Administered 2019-07-30 (×2): via INTRAVENOUS

## 2019-07-30 MED ORDER — DOCUSATE SODIUM 100 MG PO CAPS
100.0000 mg | ORAL_CAPSULE | Freq: Two times a day (BID) | ORAL | Status: DC
  Administered 2019-07-30 – 2019-08-03 (×8): 100 mg via ORAL
  Filled 2019-07-30 (×8): qty 1

## 2019-07-30 MED ORDER — FENTANYL CITRATE (PF) 100 MCG/2ML IJ SOLN
INTRAMUSCULAR | Status: DC | PRN
  Administered 2019-07-30: 50 ug via INTRAVENOUS
  Administered 2019-07-30: 100 ug via INTRAVENOUS

## 2019-07-30 MED ORDER — EPHEDRINE 5 MG/ML INJ
INTRAVENOUS | Status: AC
  Filled 2019-07-30: qty 10

## 2019-07-30 MED ORDER — METHOCARBAMOL 500 MG PO TABS: 500.0000 mg | ORAL_TABLET | Freq: Four times a day (QID) | ORAL | 0 refills | Status: DC | PRN

## 2019-07-30 MED ORDER — APIXABAN 2.5 MG PO TABS
2.5000 mg | ORAL_TABLET | Freq: Two times a day (BID) | ORAL | Status: DC
  Administered 2019-07-31 – 2019-08-03 (×7): 2.5 mg via ORAL
  Filled 2019-07-30 (×6): qty 1

## 2019-07-30 MED ORDER — PHENYLEPHRINE 40 MCG/ML (10ML) SYRINGE FOR IV PUSH (FOR BLOOD PRESSURE SUPPORT)
PREFILLED_SYRINGE | INTRAVENOUS | Status: AC
  Filled 2019-07-30: qty 10

## 2019-07-30 MED ORDER — ROCURONIUM BROMIDE 50 MG/5ML IV SOSY
PREFILLED_SYRINGE | INTRAVENOUS | Status: DC | PRN
  Administered 2019-07-30: 50 mg via INTRAVENOUS

## 2019-07-30 MED ORDER — DIPHENHYDRAMINE HCL 12.5 MG/5ML PO ELIX: 12.5000 mg | ORAL_SOLUTION | ORAL | Status: DC | PRN

## 2019-07-30 MED ORDER — HYDROCODONE-ACETAMINOPHEN 5-325 MG PO TABS: 1.0000 | ORAL_TABLET | ORAL | Status: DC | PRN

## 2019-07-30 MED ORDER — DEXAMETHASONE SODIUM PHOSPHATE 10 MG/ML IJ SOLN
INTRAMUSCULAR | Status: DC | PRN
  Administered 2019-07-30: 8 mg via INTRAVENOUS

## 2019-07-30 MED ORDER — SUCCINYLCHOLINE CHLORIDE 200 MG/10ML IV SOSY
PREFILLED_SYRINGE | INTRAVENOUS | Status: DC | PRN
  Administered 2019-07-30: 100 mg via INTRAVENOUS

## 2019-07-30 MED ORDER — DEXAMETHASONE SODIUM PHOSPHATE 10 MG/ML IJ SOLN
INTRAMUSCULAR | Status: AC
  Filled 2019-07-30: qty 1

## 2019-07-30 MED ORDER — FERROUS SULFATE 325 (65 FE) MG PO TABS: 325.0000 mg | ORAL_TABLET | Freq: Three times a day (TID) | ORAL | 0 refills | Status: DC

## 2019-07-30 MED ORDER — DEXAMETHASONE SODIUM PHOSPHATE 10 MG/ML IJ SOLN
10.0000 mg | Freq: Once | INTRAMUSCULAR | Status: AC
  Administered 2019-07-31: 10:00:00 10 mg via INTRAVENOUS
  Filled 2019-07-30: qty 1

## 2019-07-30 MED ORDER — PHENOL 1.4 % MT LIQD: 1.0000 | OROMUCOSAL | Status: DC | PRN

## 2019-07-30 MED ORDER — FERROUS SULFATE 325 (65 FE) MG PO TABS
325.0000 mg | ORAL_TABLET | Freq: Three times a day (TID) | ORAL | Status: DC
  Administered 2019-07-31 – 2019-08-03 (×11): 325 mg via ORAL
  Filled 2019-07-30 (×11): qty 1

## 2019-07-30 MED ORDER — SODIUM CHLORIDE 0.9 % IR SOLN
Status: DC | PRN
  Administered 2019-07-30: 1

## 2019-07-30 MED ORDER — ONDANSETRON HCL 4 MG/2ML IJ SOLN
INTRAMUSCULAR | Status: DC | PRN
  Administered 2019-07-30: 4 mg via INTRAVENOUS

## 2019-07-30 MED ORDER — PHENYLEPHRINE 40 MCG/ML (10ML) SYRINGE FOR IV PUSH (FOR BLOOD PRESSURE SUPPORT)
PREFILLED_SYRINGE | INTRAVENOUS | Status: DC | PRN
  Administered 2019-07-30 (×3): 120 ug via INTRAVENOUS

## 2019-07-30 SURGICAL SUPPLY — 48 items
BAG DECANTER FOR FLEXI CONT (MISCELLANEOUS) IMPLANT
BAG ZIPLOCK 12X15 (MISCELLANEOUS) IMPLANT
BLADE SAG 18X100X1.27 (BLADE) ×3 IMPLANT
BLADE SURG SZ10 CARB STEEL (BLADE) ×6 IMPLANT
COVER PERINEAL POST (MISCELLANEOUS) ×3 IMPLANT
COVER SURGICAL LIGHT HANDLE (MISCELLANEOUS) ×3 IMPLANT
COVER WAND RF STERILE (DRAPES) ×2 IMPLANT
CUP ACET PINNACLE SECTR 56MM (Hips) IMPLANT
DERMABOND ADVANCED (GAUZE/BANDAGES/DRESSINGS) ×2
DERMABOND ADVANCED .7 DNX12 (GAUZE/BANDAGES/DRESSINGS) ×1 IMPLANT
DRAPE STERI IOBAN 125X83 (DRAPES) ×3 IMPLANT
DRAPE U-SHAPE 47X51 STRL (DRAPES) ×6 IMPLANT
DRESSING AQUACEL AG SP 3.5X10 (GAUZE/BANDAGES/DRESSINGS) ×1 IMPLANT
DRSG AQUACEL AG ADV 3.5X10 (GAUZE/BANDAGES/DRESSINGS) ×2 IMPLANT
DRSG AQUACEL AG SP 3.5X10 (GAUZE/BANDAGES/DRESSINGS) ×3
DURAPREP 26ML APPLICATOR (WOUND CARE) ×3 IMPLANT
ELECT BLADE TIP CTD 4 INCH (ELECTRODE) ×3 IMPLANT
ELECT REM PT RETURN 15FT ADLT (MISCELLANEOUS) ×3 IMPLANT
ELIMINATOR HOLE APEX DEPUY (Hips) ×2 IMPLANT
GLOVE BIO SURGEON STRL SZ 6 (GLOVE) ×6 IMPLANT
GLOVE BIOGEL PI IND STRL 6.5 (GLOVE) ×1 IMPLANT
GLOVE BIOGEL PI IND STRL 7.5 (GLOVE) ×1 IMPLANT
GLOVE BIOGEL PI IND STRL 8.5 (GLOVE) ×1 IMPLANT
GLOVE BIOGEL PI INDICATOR 6.5 (GLOVE) ×2
GLOVE BIOGEL PI INDICATOR 7.5 (GLOVE) ×2
GLOVE BIOGEL PI INDICATOR 8.5 (GLOVE) ×2
GLOVE ECLIPSE 8.0 STRL XLNG CF (GLOVE) ×6 IMPLANT
GLOVE ORTHO TXT STRL SZ7.5 (GLOVE) ×6 IMPLANT
GOWN STRL REUS W/TWL LRG LVL3 (GOWN DISPOSABLE) ×6 IMPLANT
GOWN STRL REUS W/TWL XL LVL3 (GOWN DISPOSABLE) ×3 IMPLANT
HEAD M SROM 36MM PLUS 1.5 (Hips) IMPLANT
HOLDER FOLEY CATH W/STRAP (MISCELLANEOUS) ×3 IMPLANT
KIT TURNOVER KIT A (KITS) ×2 IMPLANT
PACK ANTERIOR HIP CUSTOM (KITS) ×3 IMPLANT
PINNACLE ALTRX PLUS 4 N 36X56 (Hips) ×2 IMPLANT
PINNACLE SECTOR CUP 56MM (Hips) ×3 IMPLANT
SCREW 6.5MMX30MM (Screw) ×2 IMPLANT
SROM M HEAD 36MM PLUS 1.5 (Hips) ×3 IMPLANT
STEM FEM ACTIS HIGH SZ10 (Stem) ×2 IMPLANT
SUT MNCRL AB 4-0 PS2 18 (SUTURE) ×3 IMPLANT
SUT STRATAFIX 0 PDS 27 VIOLET (SUTURE) ×3
SUT VIC AB 1 CT1 36 (SUTURE) ×9 IMPLANT
SUT VIC AB 2-0 CT1 27 (SUTURE) ×4
SUT VIC AB 2-0 CT1 TAPERPNT 27 (SUTURE) ×2 IMPLANT
SUTURE STRATFX 0 PDS 27 VIOLET (SUTURE) ×1 IMPLANT
TRAY FOLEY MTR SLVR 16FR STAT (SET/KITS/TRAYS/PACK) ×2 IMPLANT
WATER STERILE IRR 1000ML POUR (IV SOLUTION) ×3 IMPLANT
YANKAUER SUCT BULB TIP 10FT TU (MISCELLANEOUS) ×2 IMPLANT
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
## 2019-07-30 NOTE — Anesthesia Procedure Notes (Signed)
Procedure Name: Intubation Date/Time: 07/30/2019 3:39 PM Performed by: Niel Hummer, CRNA Pre-anesthesia Checklist: Patient identified, Emergency Drugs available, Suction available and Patient being monitored Patient Re-evaluated:Patient Re-evaluated prior to induction Oxygen Delivery Method: Circle system utilized Preoxygenation: Pre-oxygenation with 100% oxygen Induction Type: IV induction and Rapid sequence Laryngoscope Size: Mac and 4 Grade View: Grade II Tube type: Oral Tube size: 7.5 mm Number of attempts: 1 Airway Equipment and Method: Stylet Placement Confirmation: ETT inserted through vocal cords under direct vision,  positive ETCO2 and breath sounds checked- equal and bilateral Secured at: 22 cm Tube secured with: Tape Dental Injury: Teeth and Oropharynx as per pre-operative assessment

## 2019-07-30 NOTE — Progress Notes (Signed)
     Subjective:   Procedure(s) (LRB): TOTAL HIP ARTHROPLASTY ANTERIOR APPROACH (Left)   Patient reports pain as mild/moderate, controlled medication this point.  Dr. Ihor Gully discussed proceeding with a left total hip arthroplasty to repair his fracture today.  Surgery is planned for about 1500 today.  Patient understands the risk and benefits and expectations and does wish to proceed with surgery.  Patient has been through the surgery before and understands the plans moving forward.     Objective:   VITALS:   Vitals:   07/29/19 2051 07/30/19 0517  BP: (!) 172/72 (!) 162/91  Pulse: 65 71  Resp: 16 16  Temp: 98.4 F (36.9 C) (!) 97.4 F (36.3 C)  SpO2: 92% 93%    Dorsiflexion/Plantar flexion intact No cellulitis present Compartment soft  LABS Recent Labs    07/28/19 1534 07/29/19 0615 07/30/19 0157  HGB 14.2 14.3 15.0  HCT 43.5 43.0 44.1  WBC 7.7 11.9* 12.4*  PLT 219 181 169    Recent Labs    07/28/19 1534 07/29/19 0615  NA 135 135  K 4.3 4.2  BUN 18 11  CREATININE 0.51* 0.34*  GLUCOSE 127* 110*     Assessment/Plan:   Procedure(s) (LRB): TOTAL HIP ARTHROPLASTY ANTERIOR APPROACH (Left)  Surgery today left THA per Dr. Alvan Dame NPO  Risks, benefits and expectations were discussed with the patient.  Risks including but not limited to the risk of anesthesia, blood clots, nerve damage, blood vessel damage, failure of the prosthesis, infection and up to and including death.  Patient understand the risks, benefits and expectations and wishes to proceed with surgery.     [place] Pugh Allaina Brotzman   PAC  07/30/2019, 8:17 AM

## 2019-07-30 NOTE — Care Management Important Message (Signed)
Important Message  Patient Details IM Letter given to Sharren Bridge SW to present to the Patient Name: Joshua Carney MRN: [id number] Date of Birth: [birth year]   Medicare Important Message Given:  Yes     Kerin Salen 07/30/2019, 11:11 AM

## 2019-07-30 NOTE — Discharge Instructions (Signed)

## 2019-07-30 NOTE — Progress Notes (Signed)
ANTICOAGULATION CONSULT NOTE - Follow Up Consult  Pharmacy Consult for Heparin Indication: atrial fibrillation  No Known Allergies  Patient Measurements: Height: 5\' 9"  (175.3 cm) Weight: 185 lb (83.9 kg) IBW/kg (Calculated) : 70.7 Heparin Dosing Weight:   Vital Signs: Temp: 98.4 F (36.9 C) (07/30 2051) BP: 172/72 (07/30 2051) Pulse Rate: 65 (07/30 2051)  Labs: Recent Labs    07/28/19 1534 07/29/19 0615 07/29/19 1550 07/29/19 1623 07/30/19 0157  HGB 14.2 14.3  --   --  15.0  HCT 43.5 43.0  --   --  44.1  PLT 219 181  --   --  169  APTT  --   --  39*  --   --   LABPROT  --   --   --  12.7  --   INR  --   --   --  1.0  --   HEPARINUNFRC  --   --  <0.10*  --  0.36  CREATININE 0.51* 0.34*  --   --   --     Estimated Creatinine Clearance: 71.2 mL/min (A) (by C-G formula based on SCr of 0.34 mg/dL (L)).   Medications:  Infusions:  . sodium chloride Stopped (07/28/19 2218)  . sodium chloride 100 mL/hr at 07/29/19 2122  .  ceFAZolin (ANCEF) IV    . heparin 1,250 Units/hr (07/29/19 1725)  . methocarbamol (ROBAXIN) IV    . tranexamic acid      Assessment: Patient with heparin level at goal.  No heparin issues noted.  Goal of Therapy:  Heparin level 0.3-0.7 units/ml Monitor platelets by anticoagulation protocol: Yes   Plan:  Continue heparin drip at current rate Recheck level at [place], [place] 07/30/2019,3:20 AM

## 2019-07-30 NOTE — Interval H&P Note (Signed)
History and Physical Interval Note:  07/30/2019 3:26 PM  Joshua Carney  has presented today for surgery, with the diagnosis of Left femoral neck fracture.  The various methods of treatment have been discussed with the patient and family. After consideration of risks, benefits and other options for treatment, the patient has consented to  Procedure(s): TOTAL HIP ARTHROPLASTY ANTERIOR APPROACH (Left) as a surgical intervention.  The patient's history has been reviewed, patient examined, no change in status, stable for surgery.  I have reviewed the patient's chart and labs.  Questions were answered to the patient's satisfaction.     [name]
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
## 2019-07-30 NOTE — Anesthesia Preprocedure Evaluation (Addendum)
Anesthesia Evaluation  Patient identified by MRN, date of birth, ID band Patient awake    Reviewed: Allergy & Precautions, NPO status , Patient's Chart, lab work & pertinent test results  Airway Mallampati: II  TM Distance: >3 FB Neck ROM: Full    Dental no notable dental hx. (+) Teeth Intact, Caps,    Pulmonary former smoker,    Pulmonary exam normal breath sounds clear to auscultation       Cardiovascular hypertension, Pt. on medications + CAD and +CHF  Normal cardiovascular exam+ dysrhythmias Atrial Fibrillation  Rhythm:Regular Rate:Bradycardia  Non obstructive CAD Non ischemic CM LVEF 40% improved to 55% LBBB  Echo 04/29/2017 Left ventricle: The cavity size was normal. Wall thickness was normal. Systolic function was normal. The estimated ejection fraction was in the range of 50% to 55%. Wall motion was normal; there were no regional wall motion abnormalities. Doppler parameters are consistent with abnormal left ventricular relaxation (grade 1 diastolic dysfunction). - Ventricular septum: Septal motion showed abnormal function and dyssynergy. - Mitral valve: Calcified annulus. Mildly thickened leaflets . - Left atrium: The atrium was mildly dilated. - Atrial septum: There was an atrial septal aneurysm.   Neuro/Psych Peripheral neuropathy  Neuromuscular disease negative psych ROS   GI/Hepatic negative GI ROS, Neg liver ROS,   Endo/Other  diabetes, Well Controlled, Type 2Hypothyroidism Hyperlipidemia  Renal/GU Hx/o renal calculi   BPH ED    Musculoskeletal  (+) Arthritis , Osteoarthritis,  S/P percutaneous pinning left hip with delayed union Hx/o cervical radiculopathy   Abdominal   Peds  Hematology  (+) anemia , Eliquis therapy -last dose 2 days ago   Anesthesia Other Findings   Reproductive/Obstetrics                           Anesthesia Physical  Anesthesia Plan  ASA:  III  Anesthesia Plan: General   Post-op Pain Management:    Induction:   PONV Risk Score and Plan: 2 and Propofol infusion, Ondansetron and Treatment may vary due to age or medical condition  Airway Management Planned: Oral ETT  Additional Equipment:   Intra-op Plan:   Post-operative Plan: Extubation in OR  Informed Consent: I have reviewed the patients History and Physical, chart, labs and discussed the procedure including the risks, benefits and alternatives for the proposed anesthesia with the patient or authorized representative who has indicated his/her understanding and acceptance.     Dental advisory given  Plan Discussed with: CRNA and Surgeon  Anesthesia Plan Comments:         Anesthesia Quick Evaluation

## 2019-07-30 NOTE — Anesthesia Postprocedure Evaluation (Signed)
Anesthesia Post Note  Patient: Joshua Carney  Procedure(s) Performed: TOTAL HIP ARTHROPLASTY ANTERIOR APPROACH (Left Hip)     Patient location during evaluation: PACU Anesthesia Type: General Level of consciousness: awake and alert and oriented Pain management: pain level controlled Vital Signs Assessment: post-procedure vital signs reviewed and stable Respiratory status: spontaneous breathing, nonlabored ventilation, respiratory function stable and patient connected to nasal cannula oxygen Cardiovascular status: blood pressure returned to baseline and stable Postop Assessment: no apparent nausea or vomiting Anesthetic complications: no    Last Vitals:  Vitals:   07/30/19 1815 07/30/19 1835  BP: (!) 143/66 (!) 153/77  Pulse: 71   Resp: 20 (!) 22  Temp: (!) 36.3 C (!) 36.4 C  SpO2: 100% 100%    Last Pain:  Vitals:   07/30/19 1815  TempSrc:   PainSc: 0-No pain                 Kal Chait A.

## 2019-07-30 NOTE — Transfer of Care (Signed)
Immediate Anesthesia Transfer of Care Note  Patient: Joshua Carney  Procedure(s) Performed: TOTAL HIP ARTHROPLASTY ANTERIOR APPROACH (Left Hip)  Patient Location: PACU  Anesthesia Type:General  Level of Consciousness: drowsy and patient cooperative  Airway & Oxygen Therapy: Patient Spontanous Breathing and Patient connected to face mask oxygen  Post-op Assessment: Report given to RN and Post -op Vital signs reviewed and stable  Post vital signs: Reviewed and stable  Last Vitals:  Vitals Value Taken Time  BP 146/72 07/30/19 1724  Temp    Pulse 71 07/30/19 1730  Resp 11 07/30/19 1730  SpO2 100 % 07/30/19 1730  Vitals shown include unvalidated device data.  Last Pain:  Vitals:   07/30/19 1524  TempSrc: Oral  PainSc:          Complications: No apparent anesthesia complications

## 2019-07-30 NOTE — H&P (View-Only) (Signed)
     Subjective:   Procedure(s) (LRB): TOTAL HIP ARTHROPLASTY ANTERIOR APPROACH (Left)   Patient reports pain as mild/moderate, controlled medication this point.  Dr. Ihor Gully discussed proceeding with a left total hip arthroplasty to repair his fracture today.  Surgery is planned for about 1500 today.  Patient understands the risk and benefits and expectations and does wish to proceed with surgery.  Patient has been through the surgery before and understands the plans moving forward.     Objective:   VITALS:   Vitals:   07/29/19 2051 07/30/19 0517  BP: (!) 172/72 (!) 162/91  Pulse: 65 71  Resp: 16 16  Temp: 98.4 F (36.9 C) (!) 97.4 F (36.3 C)  SpO2: 92% 93%    Dorsiflexion/Plantar flexion intact No cellulitis present Compartment soft  LABS Recent Labs    07/28/19 1534 07/29/19 0615 07/30/19 0157  HGB 14.2 14.3 15.0  HCT 43.5 43.0 44.1  WBC 7.7 11.9* 12.4*  PLT 219 181 169    Recent Labs    07/28/19 1534 07/29/19 0615  NA 135 135  K 4.3 4.2  BUN 18 11  CREATININE 0.51* 0.34*  GLUCOSE 127* 110*     Assessment/Plan:   Procedure(s) (LRB): TOTAL HIP ARTHROPLASTY ANTERIOR APPROACH (Left)  Surgery today left THA per Dr. Alvan Dame NPO  Risks, benefits and expectations were discussed with the patient.  Risks including but not limited to the risk of anesthesia, blood clots, nerve damage, blood vessel damage, failure of the prosthesis, infection and up to and including death.  Patient understand the risks, benefits and expectations and wishes to proceed with surgery.     [place] Alijah Akram   PAC  07/30/2019, 8:17 AM

## 2019-07-31 LAB — CBC
HCT: 40.5 % (ref 39.0–52.0)
Hemoglobin: 13.2 g/dL (ref 13.0–17.0)
MCH: 31.1 pg (ref 26.0–34.0)
MCHC: 32.6 g/dL (ref 30.0–36.0)
MCV: 95.5 fL (ref 80.0–100.0)
Platelets: 206 10*3/uL (ref 150–400)
RBC: 4.24 MIL/uL (ref 4.22–5.81)
RDW: 13.1 % (ref 11.5–15.5)
WBC: 18.1 10*3/uL — ABNORMAL HIGH (ref 4.0–10.5)
nRBC: 0 % (ref 0.0–0.2)

## 2019-07-31 LAB — GLUCOSE, CAPILLARY
Glucose-Capillary: 140 mg/dL — ABNORMAL HIGH (ref 70–99)
Glucose-Capillary: 155 mg/dL — ABNORMAL HIGH (ref 70–99)
Glucose-Capillary: 130 mg/dL — ABNORMAL HIGH (ref 70–99)
Glucose-Capillary: 130 mg/dL — ABNORMAL HIGH (ref 70–99)

## 2019-07-31 LAB — BASIC METABOLIC PANEL
Anion gap: 7 (ref 5–15)
BUN: 22 mg/dL (ref 8–23)
CO2: 26 mmol/L (ref 22–32)
Calcium: 7.9 mg/dL — ABNORMAL LOW (ref 8.9–10.3)
Chloride: 97 mmol/L — ABNORMAL LOW (ref 98–111)
Creatinine, Ser: 0.61 mg/dL (ref 0.61–1.24)
GFR calc Af Amer: >60 mL/min (ref >60)
GFR calc non Af Amer: >60 mL/min (ref >60)
Glucose, Bld: 164 mg/dL — ABNORMAL HIGH (ref 70–99)
Potassium: 4.3 mmol/L (ref 3.5–5.1)
Sodium: 130 mmol/L — ABNORMAL LOW (ref 135–145)

## 2019-07-31 NOTE — Progress Notes (Signed)
    Subjective:  Patient reports pain as mild to moderate.  Denies N/V/CP/SOB. Wants to go home.  Objective:   VITALS:   Vitals:   07/30/19 1815 07/30/19 1835 07/30/19 2039 07/31/19 0545  BP: (!) 143/66 (!) 153/77 138/77 136/65  Pulse: 71  82 83  Resp: 20 (!) 22 16 16   Temp: (!) 97.3 F (36.3 C) (!) 97.5 F (36.4 C) 99 F (37.2 C) 97.9 F (36.6 C)  TempSrc:      SpO2: 100% 100% 91% 95%  Weight:      Height:        NAD ABD soft Sensation intact distally Intact pulses distally Dorsiflexion/Plantar flexion intact Incision: dressing C/D/I Compartment soft   Lab Results  Component Value Date   WBC 18.1 (H) 07/31/2019   HGB 13.2 07/31/2019   HCT 40.5 07/31/2019   MCV 95.5 07/31/2019   PLT 206 07/31/2019   BMET    Component Value Date/Time   NA 130 (L) 07/31/2019 0626   K 4.3 07/31/2019 0626   CL 97 (L) 07/31/2019 0626   CO2 26 07/31/2019 0626   GLUCOSE 164 (H) 07/31/2019 0626   BUN 22 07/31/2019 0626   CREATININE 0.61 07/31/2019 0626   CALCIUM 7.9 (L) 07/31/2019 0626   GFRNONAA >60 07/31/2019 0626   GFRAA >60 07/31/2019 0626     Assessment/Plan: 1 Day Post-Op   Principal Problem:   Closed left femoral fracture (HCC) Active Problems:   Nonischemic cardiomyopathy (HCC)   Chronic systolic CHF (congestive heart failure) (HCC)   Hypertension   BPH (benign prostatic hyperplasia)   Mixed hyperlipidemia   Atrial fibrillation with RVR (HCC)   Hypothyroidism   WBAT with walker DVT ppx: apixaban, SCDs, TEDS PO pain control PT/OT Dispo: D/C home with HHPT when ok with hospitalist   Joshua Carney 07/31/2019, 9:36 AM   Joshua Can, MD Cell: [phone] Joshua Carney is now [facility]  [place] [address], [place], [place] [address] Phone: [phone] [email or web address] Facebook  [employer]

## 2019-07-31 NOTE — Progress Notes (Signed)
PROGRESS NOTE    [name]  BMS:[id number] DOB: 03/31/1934 DOA: 07/28/2019 PCP: Derinda Late, MD    Brief Narrative:  Joshua Carney is a 85 y.o. male with medical history significant of coronary artery disease status post multiple PCI's, diabetes, hypothyroidism, hypertension, previous right total hip replacement, insomnia, cervical radiculopathy, systolic dysfunction, BPH peripheral neuropathy and chronic anticoagulation use who was brought in today after sustaining a fall in his driveway which was purely mechanical.    Assessment & Plan:   Principal Problem:   Closed left femoral fracture ([place]) Active Problems:   Nonischemic cardiomyopathy (HCC)   Chronic systolic CHF (congestive heart failure) (HCC)   Hypertension   BPH (benign prostatic hyperplasia)   Mixed hyperlipidemia   Atrial fibrillation with RVR ([place])   Hypothyroidism    Left femoral fracture:  Orthopedics consulted and underwent ORIF, plan for PT,and possible discharge in am with home health PT.  Pain control and muscle relaxants.     Chronic systolic CHF;  He appears compensated.    Hypertension; well controlled.    Chronic atrial fibrillation:  Rate controlled.  Resume eliquis .    Hypothyroidism:  Resume home dose synthroid.   DVT prophylaxis: eliquis.  Code Status: full code.  Family Communication: none at bedside.  Disposition Plan:possible d/c in am.   Consultants:   Orthopedics.   Procedures:  None.   Antimicrobials: none.   Subjective: No chest pain or sob.   Objective: Vitals:   07/30/19 1835 07/30/19 2039 07/31/19 0545 07/31/19 1308  BP: (!) 153/77 138/77 136/65 135/60  Pulse:  82 83 75  Resp: (!) 22 16 16 20   Temp: (!) 97.5 F (36.4 C) 99 F (37.2 C) 97.9 F (36.6 C) 97.8 F (36.6 C)  TempSrc:    Oral  SpO2: 100% 91% 95% 96%  Weight:      Height:        Intake/Output Summary (Last 24 hours) at 07/31/2019 1437 Last data filed at 07/31/2019 1434 Gross per 24  hour  Intake 5012.91 ml  Output 1552 ml  Net 3460.91 ml   Filed Weights   07/29/19 1500  Weight: 83.9 kg    Examination:  General exam: Appears calm and comfortable  Respiratory system: Clear to auscultation. Respiratory effort normal. Cardiovascular system: S1 & S2 heard, RRR.  Gastrointestinal system: Abdomen is soft non tender non distended bowel sounds good.  Central nervous system: Alert and oriented. No focal neurological deficits. Extremities:  ;eftleg tender.  Skin: No rashes, lesions or ulcers Psychiatry:  Mood & affect appropriate.     Data Reviewed: I have personally reviewed following labs and imaging studies  CBC: Recent Labs  Lab 07/28/19 1534 07/29/19 0615 07/30/19 0157 07/31/19 0626  WBC 7.7 11.9* 12.4* 18.1*  HGB 14.2 14.3 15.0 13.2  HCT 43.5 43.0 44.1 40.5  MCV 95.2 94.1 93.2 95.5  PLT 219 181 169 223   Basic Metabolic Panel: Recent Labs  Lab 07/28/19 1534 07/29/19 0615 07/31/19 0626  NA 135 135 130*  K 4.3 4.2 4.3  CL 100 105 97*  CO2 26 24 26   GLUCOSE 127* 110* 164*  BUN 18 11 22   CREATININE 0.51* 0.34* 0.61  CALCIUM 8.7* 8.5* 7.9*   GFR: Estimated Creatinine Clearance: 71.2 mL/min (by C-G formula based on SCr of 0.61 mg/dL). Liver Function Tests: Recent Labs  Lab 07/29/19 0615  AST 32  ALT 20  ALKPHOS 122  BILITOT 0.8  PROT 6.3*  ALBUMIN 3.3*  No results for input(s): LIPASE, AMYLASE in the last 168 hours. No results for input(s): AMMONIA in the last 168 hours. Coagulation Profile: Recent Labs  Lab 07/29/19 1623  INR 1.0   Cardiac Enzymes: No results for input(s): CKTOTAL, CKMB, CKMBINDEX, TROPONINI in the last 168 hours. BNP (last 3 results) No results for input(s): PROBNP in the last 8760 hours. HbA1C: Recent Labs    07/29/19 0615  HGBA1C 4.9   CBG: Recent Labs  Lab 07/30/19 1203 07/30/19 1443 07/30/19 2043 07/31/19 0715 07/31/19 1103  GLUCAP 82 83 160* 140* 155*   Lipid Profile: No results for  input(s): CHOL, HDL, LDLCALC, TRIG, CHOLHDL, LDLDIRECT in the last 72 hours. Thyroid Function Tests: No results for input(s): TSH, T4TOTAL, FREET4, T3FREE, THYROIDAB in the last 72 hours. Anemia Panel: No results for input(s): VITAMINB12, FOLATE, FERRITIN, TIBC, IRON, RETICCTPCT in the last 72 hours. Sepsis Labs: No results for input(s): PROCALCITON, LATICACIDVEN in the last 168 hours.  Recent Results (from the past 240 hour(s))  SARS Coronavirus 2 (CEPHEID - Performed in [place] hospital lab), Hosp Order     Status: None   Collection Time: 07/28/19  5:20 PM   Specimen: Nasopharyngeal Swab  Result Value Ref Range Status   SARS Coronavirus 2 NEGATIVE NEGATIVE Final    Comment: (NOTE) If result is NEGATIVE SARS-CoV-2 target nucleic acids are NOT DETECTED. The SARS-CoV-2 RNA is generally detectable in upper and lower  respiratory specimens during the acute phase of infection. The lowest  concentration of SARS-CoV-2 viral copies this assay can detect is 250  copies / mL. A negative result does not preclude SARS-CoV-2 infection  and should not be used as the sole basis for treatment or other  patient management decisions.  A negative result may occur with  improper specimen collection / handling, submission of specimen other  than nasopharyngeal swab, presence of viral mutation(s) within the  areas targeted by this assay, and inadequate number of viral copies  (<250 copies / mL). A negative result must be combined with clinical  observations, patient history, and epidemiological information. If result is POSITIVE SARS-CoV-2 target nucleic acids are DETECTED. The SARS-CoV-2 RNA is generally detectable in upper and lower  respiratory specimens dur ing the acute phase of infection.  Positive  results are indicative of active infection with SARS-CoV-2.  Clinical  correlation with patient history and other diagnostic information is  necessary to determine patient infection status.   Positive results do  not rule out bacterial infection or co-infection with other viruses. If result is PRESUMPTIVE POSTIVE SARS-CoV-2 nucleic acids MAY BE PRESENT.   A presumptive positive result was obtained on the submitted specimen  and confirmed on repeat testing.  While 2019 novel coronavirus  (SARS-CoV-2) nucleic acids may be present in the submitted sample  additional confirmatory testing may be necessary for epidemiological  and / or clinical management purposes  to differentiate between  SARS-CoV-2 and other Sarbecovirus currently known to infect humans.  If clinically indicated additional testing with an alternate test  methodology [phone]) is advised. The SARS-CoV-2 RNA is generally  detectable in upper and lower respiratory sp ecimens during the acute  phase of infection. The expected result is Negative. Fact Sheet for Patients:  StrictlyIdeas.no Fact Sheet for Healthcare Providers: BankingDealers.co.za This test is not yet approved or cleared by the [place] FDA and has been authorized for detection and/or diagnosis of SARS-CoV-2 by FDA under an Emergency Use Authorization (EUA).  This EUA will remain in effect (  meaning this test can be used) for the duration of the COVID-19 declaration under Section 564(b)(1) of the Act, 21 U.S.C. section 360bbb-3(b)(1), unless the authorization is terminated or revoked sooner. Performed at [facility], [place] [address] [place], [place] [address]          Radiology Studies: Dg Pelvis Portable  Result Date: 07/30/2019 CLINICAL DATA:  Status post total hip arthroplasty. EXAM: PORTABLE PELVIS 1-2 VIEWS COMPARISON:  11/28/2019 FINDINGS: The left acetabular and femoral components appear well seated without complicating features. Remote right total hip arthroplasty is unchanged. The pubic symphysis and SI joints are intact. No pelvic fractures or bone lesions.  IMPRESSION: Left total hip arthroplasty components in good position without complicating features. Electronically Signed   By: Marijo Sanes M.D.   On: 07/30/2019 18:10   Dg C-arm 1-60 Min-no Report  Result Date: 07/30/2019 Fluoroscopy was utilized by the requesting physician.  No radiographic interpretation.   Dg Hip Operative Unilat W Or W/o Pelvis Left  Result Date: 07/30/2019 CLINICAL DATA:  Operative imaging for left hip arthroplasty. EXAM: OPERATIVE LEFT HIP (WITH PELVIS IF PERFORMED) 2 VIEWS TECHNIQUE: Fluoroscopic spot image(s) were submitted for interpretation post-operatively. COMPARISON:  07/28/2019 FINDINGS: The 2 submitted images show placement of the femoral and acetabular components of the total left hip arthroplasty. These appear well seated and aligned. No acute fracture or evidence of an operative complication. IMPRESSION: Imaging provided for total left hip arthroplasty. The components appear well seated and aligned. Electronically Signed   By: Lajean Manes M.D.   On: 07/30/2019 17:25        Scheduled Meds:  ALPRAZolam  0.5 mg Oral QHS   amLODipine  5 mg Oral q morning - 10a   apixaban  2.5 mg Oral Q12H   cholecalciferol  2,000 Units Oral Daily   docusate sodium  100 mg Oral BID   ferrous sulfate  325 mg Oral TID PC   insulin aspart  0-9 Units Subcutaneous TID WC   levothyroxine  37.5 mcg Oral Once per day on Mon Tue Wed Thu Fri Sat   losartan  100 mg Oral Daily   magnesium oxide  600 mg Oral Daily   Melatonin  3 mg Oral QHS   polyethylene glycol  17 g Oral BID   Continuous Infusions:  sodium chloride Stopped (07/28/19 2218)   sodium chloride Stopped (07/30/19 1418)   sodium chloride     methocarbamol (ROBAXIN) IV       LOS: 3 days    Time spent: 28 minutes.     Hosie Poisson, MD [facility] Pager [phone]   If 7PM-7AM, please contact night-coverage [email or web address] Password [facility] 07/31/2019, 2:37 PM
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
## 2019-07-31 NOTE — Plan of Care (Signed)
  Problem: Acute Rehab PT Goals(only PT should resolve) Goal: Patient Will Transfer Sit To/From Stand Outcome: Progressing Flowsheets (Taken 07/31/2019 1339) Patient will transfer sit to/from stand: with modified independence Goal: Pt Will Transfer Bed To Chair/Chair To Bed Outcome: Progressing Flowsheets (Taken 07/31/2019 1339) Pt will Transfer Bed to Chair/Chair to Bed: with modified independence Goal: Pt Will Ambulate Outcome: Progressing Flowsheets (Taken 07/31/2019 1339) Pt will Ambulate:  > 125 feet  with supervision  with least restrictive assistive device Goal: Pt Will Go Up/Down Stairs Outcome: Progressing Flowsheets (Taken 07/31/2019 1339) Pt will Go Up / Down Stairs:  Flight  with supervision  with rail(s)

## 2019-08-01 LAB — GLUCOSE, CAPILLARY
Glucose-Capillary: 118 mg/dL — ABNORMAL HIGH (ref 70–99)
Glucose-Capillary: 104 mg/dL — ABNORMAL HIGH (ref 70–99)
Glucose-Capillary: 105 mg/dL — ABNORMAL HIGH (ref 70–99)
Glucose-Capillary: 89 mg/dL (ref 70–99)
Glucose-Capillary: 90 mg/dL (ref 70–99)

## 2019-08-01 LAB — BASIC METABOLIC PANEL
Anion gap: 8 (ref 5–15)
BUN: 16 mg/dL (ref 8–23)
CO2: 25 mmol/L (ref 22–32)
Calcium: 8.2 mg/dL — ABNORMAL LOW (ref 8.9–10.3)
Chloride: 100 mmol/L (ref 98–111)
Creatinine, Ser: 0.4 mg/dL — ABNORMAL LOW (ref 0.61–1.24)
GFR calc Af Amer: >60 mL/min (ref >60)
GFR calc non Af Amer: >60 mL/min (ref >60)
Glucose, Bld: 114 mg/dL — ABNORMAL HIGH (ref 70–99)
Potassium: 4.2 mmol/L (ref 3.5–5.1)
Sodium: 133 mmol/L — ABNORMAL LOW (ref 135–145)

## 2019-08-01 LAB — CBC
HCT: 37.3 % — ABNORMAL LOW (ref 39.0–52.0)
Hemoglobin: 12.4 g/dL — ABNORMAL LOW (ref 13.0–17.0)
MCH: 31.1 pg (ref 26.0–34.0)
MCHC: 33.2 g/dL (ref 30.0–36.0)
MCV: 93.5 fL (ref 80.0–100.0)
Platelets: 210 10*3/uL (ref 150–400)
RBC: 3.99 MIL/uL — ABNORMAL LOW (ref 4.22–5.81)
RDW: 12.9 % (ref 11.5–15.5)
WBC: 17 10*3/uL — ABNORMAL HIGH (ref 4.0–10.5)
nRBC: 0 % (ref 0.0–0.2)
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
## 2019-08-01 NOTE — Progress Notes (Signed)
Subjective: 2 Days Post-Op Procedure(s) (LRB): TOTAL HIP ARTHROPLASTY ANTERIOR APPROACH (Left)  Patient reports pain as mild to moderate.  Tolerating POs well.  Admits to BM.  Denies fever, chills, N/V, CP, SOB.  States that he worked well with therapy.  Has concerns about ascending/descending stairs at home.  Nurse reports that [facility] nurse and PT have been approved.  Objective:   VITALS:  Temp:  [97.8 F (36.6 C)-98 F (36.7 C)] 98 F (36.7 C) (08/02 0521) Pulse Rate:  [67-75] 72 (08/02 0521) Resp:  [18-20] 18 (08/02 0521) BP: (135-172)/(60-72) 172/72 (08/02 0521) SpO2:  [95 %-96 %] 95 % (08/02 0521)  General: WDWN patient in NAD. Psych:  Appropriate mood and affect. Neuro:  A&O x 3, Moving all extremities, sensation intact to light touch HEENT:  EOMs intact Chest:  Even non-labored respirations Skin:  Dresssing C/D/I, no rashes or lesions Extremities: warm/dry, mild edema, no erythema or echymosis.  No lymphadenopathy. Pulses: Popliteus 2+ MSK:  ROM: TKE, MMT: able to perform quad set, (-) Homan's    LABS Recent Labs    07/30/19 0157 07/31/19 0626 08/01/19 0651  HGB 15.0 13.2 12.4*  WBC 12.4* 18.1* 17.0*  PLT 169 206 210   Recent Labs    07/31/19 0626 08/01/19 0651  NA 130* 133*  K 4.3 4.2  CL 97* 100  CO2 26 25  BUN 22 16  CREATININE 0.61 0.40*  GLUCOSE 164* 114*   Recent Labs    07/29/19 1623  INR 1.0     Assessment/Plan: 2 Days Post-Op Procedure(s) (LRB): TOTAL HIP ARTHROPLASTY ANTERIOR APPROACH (Left)  Patient seen in rounds for Dr. Silvano Bilis L LE with rolling walker Up with therapy DVT ppx: apixaban Disp: D/C home with HHPT when ready per Medicine team. Plan for outpatient post-op visit with Dr. Lorinda Creed PA-C [facility] Office:  [phone]

## 2019-08-01 NOTE — Progress Notes (Signed)
Writer called ortho floor again to remind of PT to see pt.
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
## 2019-08-01 NOTE — Progress Notes (Signed)
Writer called Ortho floor to request PT to see pt today for evaluation of his ability to use stairs. Secretary, Amy, said she would let "Hunter" know.
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
## 2019-08-02 ENCOUNTER — Encounter (HOSPITAL_COMMUNITY): Payer: Self-pay | Admitting: Orthopedic Surgery

## 2019-08-02 LAB — GLUCOSE, CAPILLARY
Glucose-Capillary: 93 mg/dL (ref 70–99)
Glucose-Capillary: 93 mg/dL (ref 70–99)
Glucose-Capillary: 87 mg/dL (ref 70–99)
Glucose-Capillary: 145 mg/dL — ABNORMAL HIGH (ref 70–99)

## 2019-08-02 LAB — BASIC METABOLIC PANEL
Anion gap: 7 (ref 5–15)
BUN: 18 mg/dL (ref 8–23)
CO2: 22 mmol/L (ref 22–32)
Calcium: 7.6 mg/dL — ABNORMAL LOW (ref 8.9–10.3)
Chloride: 101 mmol/L (ref 98–111)
Creatinine, Ser: 0.42 mg/dL — ABNORMAL LOW (ref 0.61–1.24)
GFR calc Af Amer: >60 mL/min (ref >60)
GFR calc non Af Amer: >60 mL/min (ref >60)
Glucose, Bld: 100 mg/dL — ABNORMAL HIGH (ref 70–99)
Potassium: 4.1 mmol/L (ref 3.5–5.1)
Sodium: 130 mmol/L — ABNORMAL LOW (ref 135–145)

## 2019-08-02 LAB — CBC
HCT: 36.9 % — ABNORMAL LOW (ref 39.0–52.0)
Hemoglobin: 12.1 g/dL — ABNORMAL LOW (ref 13.0–17.0)
MCH: 31.4 pg (ref 26.0–34.0)
MCHC: 32.8 g/dL (ref 30.0–36.0)
MCV: 95.8 fL (ref 80.0–100.0)
Platelets: 233 10*3/uL (ref 150–400)
RBC: 3.85 MIL/uL — ABNORMAL LOW (ref 4.22–5.81)
RDW: 13.1 % (ref 11.5–15.5)
WBC: 12.8 10*3/uL — ABNORMAL HIGH (ref 4.0–10.5)
nRBC: 0 % (ref 0.0–0.2)
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
## 2019-08-02 NOTE — Care Management Important Message (Signed)
Important Message  Patient Details IM Letter given to Sharren Bridge SW to present to the Patient Name: Joshua Carney MRN: [id number] Date of Birth: [birth year]   Medicare Important Message Given:  Yes     Kerin Salen 08/02/2019, 1:08 PM
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
## 2019-08-02 NOTE — Progress Notes (Addendum)
Subjective:  3 Days Post-Op Procedure(s) (LRB): TOTAL HIP ARTHROPLASTY ANTERIOR APPROACH (Left) Patient reports pain as mild.   Patient seen in rounds for Dr. Alvan Dame. Patient is well, and has had no acute complaints or problems other than discomfort in the left hip. Patient is motivated to discharge home in order to progress more with ambulation and mobility. His bedrooms/bathrooms are on the 2nd floor of his home, and he has not yet practiced stairs with PT. Denies CP, SHOB, nausea, vomiting.  We will continue therapy today.   Objective: Vital signs in last 24 hours: Temp:  [98.1 F (36.7 C)-98.9 F (37.2 C)] 98.8 F (37.1 C) (08/03 0500) Pulse Rate:  [62-67] 62 (08/03 0500) Resp:  [19-20] 19 (08/03 0500) BP: (133-148)/(56-80) 148/62 (08/03 0500) SpO2:  [94 %-100 %] 94 % (08/03 0500)  Intake/Output from previous day:  Intake/Output Summary (Last 24 hours) at 08/02/2019 1218 Last data filed at 08/02/2019 1000 Gross per 24 hour  Intake 1247 ml  Output 1977 ml  Net -730 ml     Intake/Output this shift: Total I/O In: 237 [P.O.:237] Out: 775 [Urine:775]  Labs: Recent Labs    07/31/19 0626 08/01/19 0651 08/02/19 0553  HGB 13.2 12.4* 12.1*   Recent Labs    08/01/19 0651 08/02/19 0553  WBC 17.0* 12.8*  RBC 3.99* 3.85*  HCT 37.3* 36.9*  PLT 210 233   Recent Labs    08/01/19 0651 08/02/19 0553  NA 133* 130*  K 4.2 4.1  CL 100 101  CO2 25 22  BUN 16 18  CREATININE 0.40* 0.42*  GLUCOSE 114* 100*  CALCIUM 8.2* 7.6*   No results for input(s): LABPT, INR in the last 72 hours.  Exam: General - Patient is Alert and Oriented Extremity - Neurologically intact Sensation intact distally Intact pulses distally Dorsiflexion/Plantar flexion intact Dressing - dressing C/D/I Motor Function - intact, moving foot and toes well on exam.   Past Medical History:  Diagnosis Date  . Anticoagulant long-term use    Eliquis  . Benign localized prostatic hyperplasia with  lower urinary tract symptoms (LUTS)   . Chronic systolic (congestive) heart failure ([place])    followed by dr hochrein  . Coronary artery disease    cardiologist-  dr hochrein--  per cardiac cath 01-21-2013  non-obstructive (LAD 25%,  Diagonal 25%,  RI 50%, AV groove 95%, pRCA 30%,  PDA ostial 30%,  ef 55%)   . Degenerative disc disease, cervical   . ED (erectile dysfunction)   . Hearing loss   . History of diabetes mellitus    hx of diabetes , per pt lost alot of weight no longer take medication and longer dx as diabetic  . History of kidney stones   . History of skin cancer   . Hypertension   . Hypothyroidism   . Insomnia   . Internal hemorrhoids   . Left bundle branch block   . Left cervical radiculopathy   . Mild atherosclerosis of carotid artery, bilateral    last duplex 06-02-2018,  bilateral ICA 1-39%  . Mixed hyperlipidemia   . Nonischemic cardiomyopathy ([place])    ( echo 11/ 2013 , ef 35-40%)  last echo 04-29-2017,  ef 50-55%  . Osteoarthritis    right knee  . PAF (paroxysmal atrial fibrillation) ([place]) 03/2017   cardiologist-- dr Percival Spanish  . Peripheral neuropathy   . Wears glasses     Assessment/Plan: 3 Days Post-Op Procedure(s) (LRB): TOTAL HIP ARTHROPLASTY ANTERIOR APPROACH (Left) Principal Problem:  Closed left femoral fracture (HCC) Active Problems:   Nonischemic cardiomyopathy (HCC)   Chronic systolic CHF (congestive heart failure) (HCC)   Hypertension   BPH (benign prostatic hyperplasia)   Mixed hyperlipidemia   Atrial fibrillation with RVR (HCC)   Hypothyroidism  Estimated body mass index is 27.32 kg/m as calculated from the following:   Height as of this encounter: 5\' 9"  (1.753 m).   Weight as of this encounter: 83.9 kg. Advance diet Up with therapy  DVT Prophylaxis - Eliquis Weight bearing as tolerated.  Plan is to go Home after hospital stay. Patient will be ready to discharge home from an orthopedic standpoint when he is meeting goals with  therapy, and otherwise per Medicine team. HHPT arranged. He will continue working with PT on stairs today. He was instructed to call the office to set up a 2 week post op appointment with Dr. Alvan Dame.  Griffith Citron, PA-C Orthopedic Surgery 08/02/2019, 12:18 PM
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
## 2019-08-03 LAB — GLUCOSE, CAPILLARY
Glucose-Capillary: 119 mg/dL — ABNORMAL HIGH (ref 70–99)
Glucose-Capillary: 98 mg/dL (ref 70–99)

## 2019-08-03 MED ORDER — BISACODYL 10 MG RE SUPP: 10.0000 mg | Freq: Every day | RECTAL | 0 refills | Status: DC | PRN
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
## 2019-08-03 NOTE — Progress Notes (Signed)
Discussed home health recommendation with pt- he requests to use [facility] (which provided him with HHPT fall 2019)- CSW referred and pt accepted to begin services at [facility].  Sharren Bridge, MSW, LCSW Transitions of Care 08/03/2019 [phone]

## 2019-08-03 NOTE — Progress Notes (Signed)
Randye Lobo to be D/C'd Home per MD order.  Discussed prescriptions and follow up appointments with the patient. Prescriptions given to patient, medication list explained in detail. Pt verbalized understanding.  Allergies as of 08/03/2019   No Known Allergies     Medication List    TAKE these medications   ALPRAZolam 0.5 MG tablet Commonly known as: XANAX Take 0.5 mg by mouth at bedtime.   amLODipine 5 MG tablet Commonly known as: NORVASC Take 5 mg by mouth every morning.   B COMPLEX 100 PO Take 100 mg by mouth daily.   bisacodyl 10 MG suppository Commonly known as: DULCOLAX Place 1 suppository (10 mg total) rectally daily as needed for moderate constipation.   docusate sodium 100 MG capsule Commonly known as: Colace Take 1 capsule (100 mg total) by mouth 2 (two) times daily.   Eliquis 5 MG Tabs tablet Generic drug: apixaban Take 1 tablet (5 mg total) by mouth 2 (two) times daily.   EQL CoQ10 300 MG Caps Generic drug: Coenzyme Q10 Take 300 mg by mouth daily.   Evening Primrose Oil 1000 MG Caps Take 1,000 mg by mouth daily.   ferrous sulfate 325 (65 FE) MG tablet Commonly known as: FerrouSul Take 1 tablet (325 mg total) by mouth 3 (three) times daily with meals for 14 days.   HYDROcodone-acetaminophen 7.5-325 MG tablet Commonly known as: Norco Take 1-2 tablets by mouth every 4 (four) hours as needed for moderate pain. What changed: Another medication with the same name was removed. Continue taking this medication, and follow the directions you see here.   hydroxypropyl methylcellulose / hypromellose 2.5 % ophthalmic solution Commonly known as: ISOPTO TEARS / GONIOVISC Place 1 drop into both eyes daily as needed for dry eyes.   levothyroxine 75 MCG tablet Commonly known as: SYNTHROID Take 37.5 mcg by mouth See admin instructions. Take 37.5 mcg by mouth daily in the evening except do NOT take on Sunday   losartan 100 MG tablet Commonly known as: COZAAR Take 100  mg by mouth daily.   MAGNESIUM-OXIDE PO Take 600 mg by mouth daily.   Melatonin 3 MG Tabs Take 3 mg by mouth at bedtime.   methocarbamol 500 MG tablet Commonly known as: Robaxin Take 1 tablet (500 mg total) by mouth every 6 (six) hours as needed for muscle spasms.   nitroGLYCERIN 0.4 MG SL tablet Commonly known as: NITROSTAT PLACE 1 TABLET UNDER THE TONGUE EVERY 5 MINUTES AS NEEDED FOR CHEST PAIN What changed: See the new instructions.   polyethylene glycol 17 g packet Commonly known as: MIRALAX / GLYCOLAX Take 17 g by mouth 2 (two) times daily. What changed:   when to take this  reasons to take this   PRESERVISION AREDS 2 PO Take 1 capsule by mouth 2 (two) times daily.   traZODone 50 MG tablet Commonly known as: DESYREL Take 50 mg by mouth at bedtime.   Vitamin D3 50 MCG (2000 UT) Tabs Take 2,000 Units by mouth daily.       Vitals:   08/02/19 2155 08/03/19 0449  BP: (!) 143/59 (!) 146/67  Pulse: 64 64  Resp: 20 16  Temp: 98.1 F (36.7 C) 98.8 F (37.1 C)  SpO2: 99% 94%    Skin clean, dry and intact without evidence of skin break down, no evidence of skin tears noted. IV catheter discontinued intact. Site without signs and symptoms of complications. Dressing and pressure applied. Pt denies pain at this time. No complaints  noted.  An After Visit Summary was printed and given to the patient. Patient escorted via [place], and D/C home via private auto.  Nonie Hoyer S 08/03/2019 2:22 PM
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
## 2019-08-09 ENCOUNTER — Other Ambulatory Visit: Payer: Self-pay | Admitting: Cardiology

## 2019-08-09 NOTE — Telephone Encounter (Signed)
61m 83.6kg Scr 0.42 08/02/19 Lovw/hochrein 07/17/18

## 2019-08-09 NOTE — Telephone Encounter (Signed)
Refill Request.  

## 2019-08-11 NOTE — Progress Notes (Signed)
Virtual Visit via Telephone Note   This visit type was conducted due to national recommendations for restrictions regarding the COVID-19 Pandemic (e.g. social distancing) in an effort to limit this patient's exposure and mitigate transmission in our community.  Due to his co-morbid illnesses, this patient is at least at moderate risk for complications without adequate follow up.  This format is felt to be most appropriate for this patient at this time.  The patient did not have access to video technology/had technical difficulties with video requiring transitioning to audio format only (telephone).  All issues noted in this document were discussed and addressed.  No physical exam could be performed with this format.  Please refer to the patient's chart for his  consent to telehealth for [facility].   Date:  08/12/2019   ID:  Joshua Carney, DOB 25-Mar-1934, MRN [id number]  Patient Location: Home Provider Location: Home  PCP:  Derinda Late, MD  Cardiologist:  [name], MD  Electrophysiologist:  None   Evaluation Performed:  Follow-Up Visit  Chief Complaint:  CAD  History of Present Illness:    Joshua Carney is a 85 y.o. male who presents for evaluation of atrial fib.  Previously he had a cardiomyopathy with left bundle branch block.  Cath demonstrated 25% LAD stenosis, 25% diagonal stenosis, 50% ramus intermediate stenosis, 95% stenosis in the AV groove leading into a possibly moderate sized marginal. The right coronary artery proximal 30% stenosis. The PDA had ostial 30% stenosis. The EF appeared to be about 55% on cath.  He was hospitalized in April of 2018 with atrial fib.  He was treated with rate control and started on Eliquis.  Echo showed normal EF.  He had a CT which suggested severe stenosis of the left internal carotid artery.  However, Doppler suggested mild stenosis.  Since I last saw him he has had three hip surgeries secondary to trauma.    Despite the surgeries and  all the rehab he is actually done well from a cardiovascular standpoint.  He has not had any cardiovascular complaints.  There is been no fibrillation.  I looked through the hospital records and we were needed consulted during these hospitalizations as he was doing so well.  He denies any ongoing cardiovascular symptoms such as chest pressure, neck or arm discomfort.  He said no palpitations, presyncope or syncope.  He is had no weight gain or edema.  The patient does not have symptoms concerning for COVID-19 infection (fever, chills, cough, or new shortness of breath).    Past Medical History:  Diagnosis Date  . Anticoagulant long-term use    Eliquis  . Benign localized prostatic hyperplasia with lower urinary tract symptoms (LUTS)   . Chronic systolic (congestive) heart failure ([place])    followed by dr Naethan Bracewell  . Coronary artery disease    cardiologist-  dr Marlys Stegmaier--  per cardiac cath 01-21-2013  non-obstructive (LAD 25%,  Diagonal 25%,  RI 50%, AV groove 95%, pRCA 30%,  PDA ostial 30%,  ef 55%)   . Degenerative disc disease, cervical   . ED (erectile dysfunction)   . Hearing loss   . History of diabetes mellitus    hx of diabetes , per pt lost alot of weight no longer take medication and longer dx as diabetic  . History of kidney stones   . History of skin cancer   . Hypertension   . Hypothyroidism   . Insomnia   . Internal hemorrhoids   .  Left bundle branch block   . Left cervical radiculopathy   . Mild atherosclerosis of carotid artery, bilateral    last duplex 06-02-2018,  bilateral ICA 1-39%  . Mixed hyperlipidemia   . Nonischemic cardiomyopathy ([place])    ( echo 11/ 2013 , ef 35-40%)  last echo 04-29-2017,  ef 50-55%  . Osteoarthritis    right knee  . PAF (paroxysmal atrial fibrillation) ([place]) 03/2017   cardiologist-- dr Percival Spanish  . Peripheral neuropathy   . Wears glasses    Past Surgical History:  Procedure Laterality Date  . CATARACT EXTRACTION W/ INTRAOCULAR LENS  IMPLANT Left 2017  . CONVERSION TO TOTAL HIP Right 12/24/2018   Procedure: CONVERSION TO RIGHT ANTERIOR TOTAL HIP;  Surgeon: Paralee Cancel, MD;  Location: [facility];  Service: Orthopedics;  Laterality: Right;  90 mins  . HAND SURGERY  teen   pinning left hand fracture  . [place];  05-20-2003  dr Zella Richer  . HIP PINNING,CANNULATED Right 09/22/2018   Procedure: RIGHT HIP CLOSED REDUCITON WITH PERCUATANEOUS SCREW FIXATION;  Surgeon: Paralee Cancel, MD;  Location: [facility];  Service: Orthopedics;  Laterality: Right;  90 mins  . KNEE ARTHROSCOPY Left 1998  . LEFT HEART CATHETERIZATION WITH CORONARY ANGIOGRAM N/A 01/21/2013   Procedure: LEFT HEART CATHETERIZATION WITH CORONARY ANGIOGRAM;  Surgeon: [name], MD;  Location: [facility] CATH LAB;  Service: Cardiovascular;  Laterality: N/A;  . [place]   L2-3  . TONSILLECTOMY AND ADENOIDECTOMY  child  . TOTAL HIP ARTHROPLASTY Left 07/30/2019   Procedure: TOTAL HIP ARTHROPLASTY ANTERIOR APPROACH;  Surgeon: Paralee Cancel, MD;  Location: [facility];  Service: Orthopedics;  Laterality: Left;  . TOTAL KNEE ARTHROPLASTY Left 12-16-2006    dr Theda Sers  @[facility]      Current Meds  Medication Sig  . ALPRAZolam (XANAX) 0.5 MG tablet Take 0.5 mg by mouth at bedtime.   Marland Kitchen amLODipine (NORVASC) 5 MG tablet Take 5 mg by mouth every morning.   . B Complex Vitamins (B COMPLEX 100 PO) Take 100 mg by mouth daily.  . bisacodyl (DULCOLAX) 10 MG suppository Place 1 suppository (10 mg total) rectally daily as needed for moderate constipation.  . Cholecalciferol (VITAMIN D3) 50 MCG (2000 UT) TABS Take 2,000 Units by mouth daily.   . Coenzyme Q10 (EQL COQ10) 300 MG CAPS Take 300 mg by mouth daily.   Marland Kitchen docusate sodium (COLACE) 100 MG capsule Take 1 capsule (100 mg total) by mouth 2 (two) times daily.  Marland Kitchen ELIQUIS 5 MG TABS tablet TAKE 1 TABLET(5 MG) BY MOUTH TWICE DAILY  . Evening Primrose Oil 1000 MG CAPS Take 1,000 mg by mouth daily.  . ferrous sulfate  (FERROUSUL) 325 (65 FE) MG tablet Take 1 tablet (325 mg total) by mouth 3 (three) times daily with meals for 14 days.  Marland Kitchen HYDROcodone-acetaminophen (NORCO) 7.5-325 MG tablet Take 1-2 tablets by mouth every 4 (four) hours as needed for moderate pain.  . hydroxypropyl methylcellulose / hypromellose (ISOPTO TEARS / GONIOVISC) 2.5 % ophthalmic solution Place 1 drop into both eyes daily as needed for dry eyes.   Marland Kitchen levothyroxine (SYNTHROID, LEVOTHROID) 75 MCG tablet Take 37.5 mcg by mouth See admin instructions. Take 37.5 mcg by mouth daily in the evening except do NOT take on Sunday  . losartan (COZAAR) 100 MG tablet Take 100 mg by mouth daily.  Marland Kitchen MAGNESIUM-OXIDE PO Take 600 mg by mouth daily.  . Melatonin 3 MG TABS Take 3 mg by mouth at  bedtime.   . methocarbamol (ROBAXIN) 500 MG tablet Take 1 tablet (500 mg total) by mouth every 6 (six) hours as needed for muscle spasms.  . Multiple Vitamins-Minerals (PRESERVISION AREDS 2 PO) Take 1 capsule by mouth 2 (two) times daily.  . nitroGLYCERIN (NITROSTAT) 0.4 MG SL tablet PLACE 1 TABLET UNDER THE TONGUE EVERY 5 MINUTES AS NEEDED FOR CHEST PAIN (Patient taking differently: Place 0.4 mg under the tongue every 5 (five) minutes as needed. )  . polyethylene glycol (MIRALAX / GLYCOLAX) 17 g packet Take 17 g by mouth 2 (two) times daily.  . traZODone (DESYREL) 50 MG tablet Take 50 mg by mouth at bedtime.     Allergies:   Patient has no known allergies.   Social History   Tobacco Use  . Smoking status: Former Smoker    Years: 8.00    Types: Cigarettes    Quit date: 12/17/1964    Years since quitting: 54.6  . Smokeless tobacco: Never Used  Substance Use Topics  . Alcohol use: Yes    Comment: seldom  . Drug use: Never     Family Hx: The patient's family history includes Atrial fibrillation in his sister; Gallbladder disease in his father; High blood pressure in his sister; Hypertension in his father and mother.  ROS:   Please see the history of present  illness.    As stated in the HPI and negative for all other systems.   Prior CV studies:   The following studies were reviewed today:  None  Labs/Other Tests and Data Reviewed:    EKG:  No ECG reviewed.  Recent Labs: 07/29/2019: ALT 20 08/02/2019: BUN 18; Creatinine, Ser 0.42; Hemoglobin 12.1; Platelets 233; Potassium 4.1; Sodium 130   Recent Lipid Panel Lab Results  Component Value Date/Time   CHOL 120 04/29/2017 02:54 AM   TRIG 42 04/29/2017 02:54 AM   HDL 38 (L) 04/29/2017 02:54 AM   CHOLHDL 3.2 04/29/2017 02:54 AM   LDLCALC 74 04/29/2017 02:54 AM    Wt Readings from Last 3 Encounters:  08/12/19 182 lb (82.6 kg)  07/29/19 185 lb (83.9 kg)  12/24/18 180 lb 8 oz (81.9 kg)     Objective:    Vital Signs:  BP 126/77   Ht 5\' 9"  (1.753 m)   Wt 182 lb (82.6 kg)   BMI 26.88 kg/m    VITAL SIGNS:  reviewed  ASSESSMENT & PLAN:    ATRIAL FIB:     The patient has documented atrial fibrillation.  Mr. TY BUNTROCK has a CHA2DS2 - VASc score of 5.  He is doing well with the anticoagulant.  No change in therapy.   CAD:  The patient has no new sypmtoms.  No further cardiovascular testing is indicated.  We will continue with aggressive risk reduction and meds as listed.  CARDIOMYOPATHY:  EF was 55% on the most recent echo.  No change in therapy.   HTN:  The blood pressure is at target.  No change in therapy.   HYPERLIPIDEMIA:  LDL was 74 two years ago and I don't have more recent data.  I will defer to Derinda Late, MD   CAROTID STENOSIS:   This was mild.  No follow up indicated at this time.    COVID-19 Education: The signs and symptoms of COVID-19 were discussed with the patient and how to seek care for testing (follow up with PCP or arrange E-visit).  The importance of social distancing was discussed today.  Time:  Today, I have spent 25 minutes with the patient/chart with telehealth technology discussing the above problems.     Medication Adjustments/Labs and  Tests Ordered: Current medicines are reviewed at length with the patient today.  Concerns regarding medicines are outlined above.   Tests Ordered: No orders of the defined types were placed in this encounter.   Medication Changes: No orders of the defined types were placed in this encounter.   Follow Up:  In Person with me in one year.   Signed, [name], MD  08/12/2019 11:54 AM    [facility]

## 2019-08-12 ENCOUNTER — Telehealth (INDEPENDENT_AMBULATORY_CARE_PROVIDER_SITE_OTHER): Payer: Medicare Other | Admitting: Cardiology

## 2019-08-12 ENCOUNTER — Encounter: Payer: Self-pay | Admitting: Cardiology

## 2019-08-12 VITALS — BP 126/77 | Ht 69.0 in | Wt 182.0 lb

## 2019-08-12 DIAGNOSIS — I2510 Atherosclerotic heart disease of native coronary artery without angina pectoris: Secondary | ICD-10-CM

## 2019-08-12 DIAGNOSIS — I480 Paroxysmal atrial fibrillation: Secondary | ICD-10-CM

## 2019-08-12 DIAGNOSIS — Z7189 Other specified counseling: Secondary | ICD-10-CM

## 2019-08-12 DIAGNOSIS — E785 Hyperlipidemia, unspecified: Secondary | ICD-10-CM

## 2019-08-12 DIAGNOSIS — I10 Essential (primary) hypertension: Secondary | ICD-10-CM

## 2019-08-12 NOTE — Patient Instructions (Signed)

## 2019-08-31 ENCOUNTER — Telehealth: Payer: Medicare Other | Admitting: Cardiology

## 2019-12-24 IMAGING — MR MR HEAD WO/W CM
8 of 10 series · 36 of 48 positions shown · IV contrast (MULTIHANCE)
Comparison: Brain MRIs 04/28/2017, 12/19/2014

CLINICAL DATA: 83-year-old male with ongoing left lower extremity
weakness, instability, problems with gait.

EXAM:
MRI HEAD WITHOUT AND WITH CONTRAST
TECHNIQUE: Multiplanar, multiecho pulse sequences of the brain and surrounding
structures were obtained without and with intravenous contrast.
CONTRAST:  15mL MULTIHANCE GADOBENATE DIMEGLUMINE 529 MG/ML IV SOLN

[Series 3: T1 · sagittal · 5.0mm · 0.90mm/px · 4 of 27 slices shown (1 of 2)]
[im 1/27]
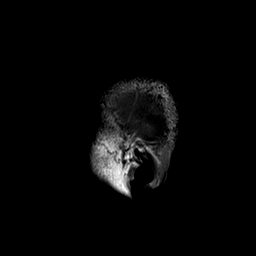
[im 9/27]
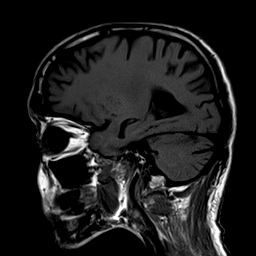
[im 18/27]
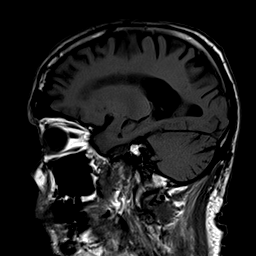
[im 27/27]
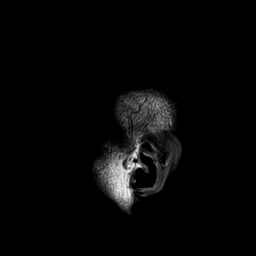

[Series 4: DWI · axial · 3.0mm · 1.88mm/px · z∈[-48,+104]mm · 11 of 95 slices shown (1 of 2)]
[im 1/95]
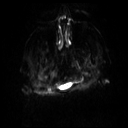
[im 10/95]
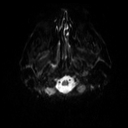
[im 19/95]
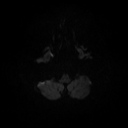
[im 29/95]
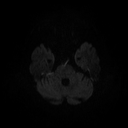
[im 38/95]
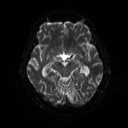
[im 48/95]
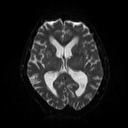
[im 57/95]
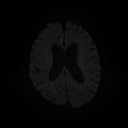
[im 66/95]
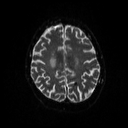
[im 76/95]
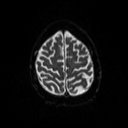
[im 85/95]
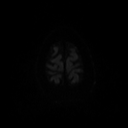
[im 95/95]
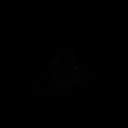

[Series 5: DWI · axial · 3.0mm · 1.88mm/px · z∈[-48,+104]mm · 5 of 48 slices shown (2 of 2)]
[im 1/48]
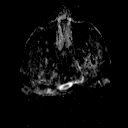
[im 12/48]
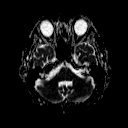
[im 24/48]
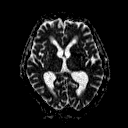
[im 36/48]
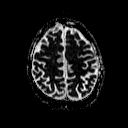
[im 48/48]
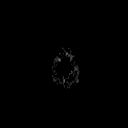

[Series 6: T2 · axial · 5.0mm · 0.69mm/px · z∈[-52,+107]mm · 3 of 28 slices shown (1 of 3)]
[im 1/28]
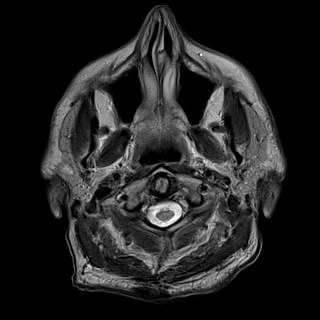
[im 14/28]
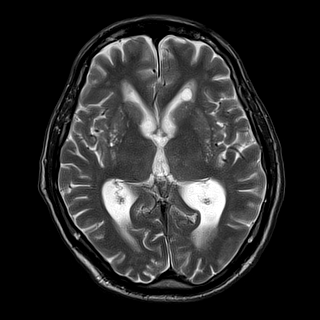
[im 28/28]
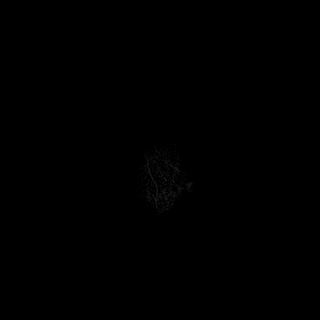

[Series 7: T2 · axial · 5.0mm · 0.43mm/px · z∈[-52,+107]mm · 3 of 28 slices shown (2 of 3)]
[im 1/28]
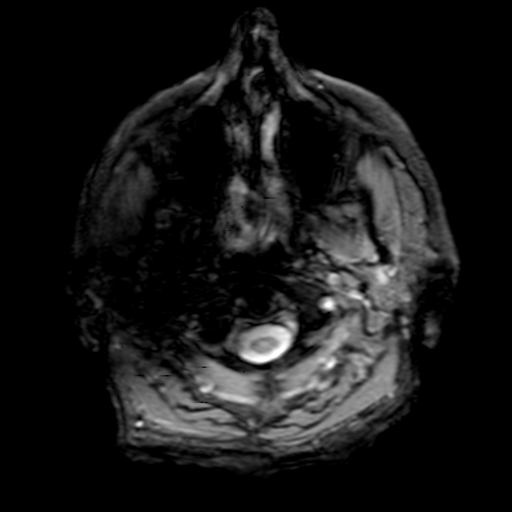
[im 14/28]
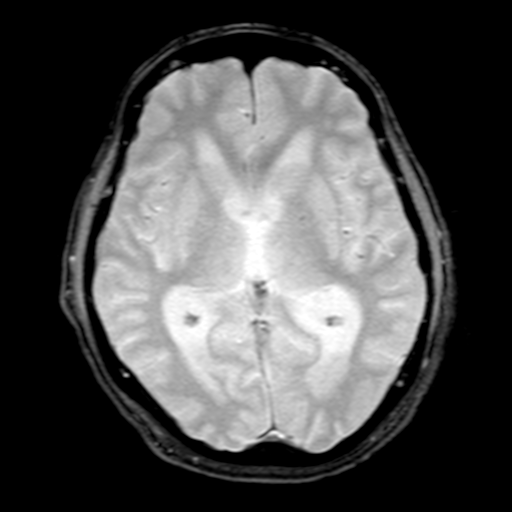
[im 28/28]
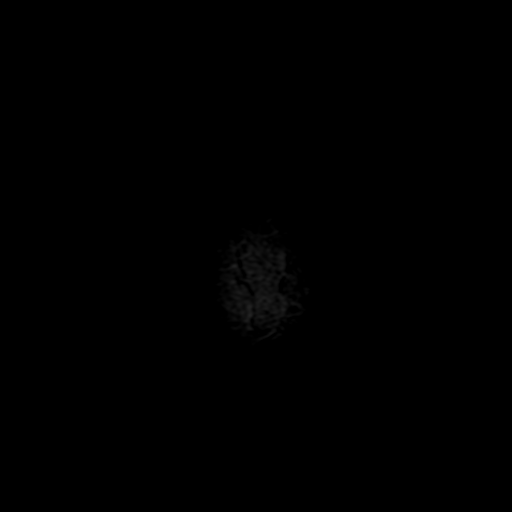

[Series 8: FLAIR · axial · 3.0mm · 0.43mm/px · z∈[-53,+107]mm · 4 of 42 slices shown]
[im 1/42]
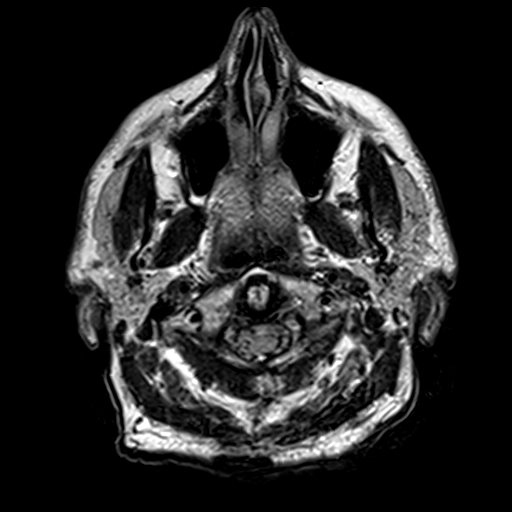
[im 14/42]
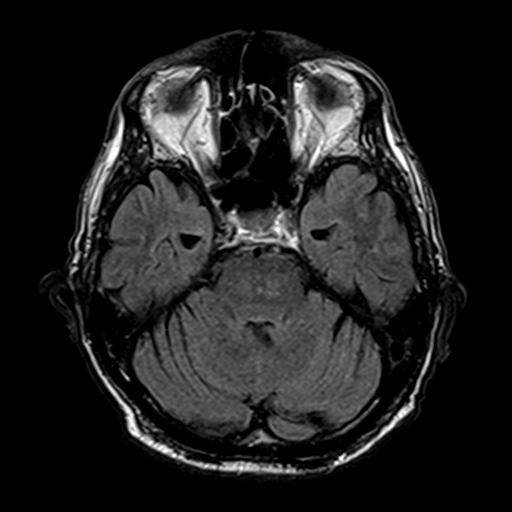
[im 28/42]
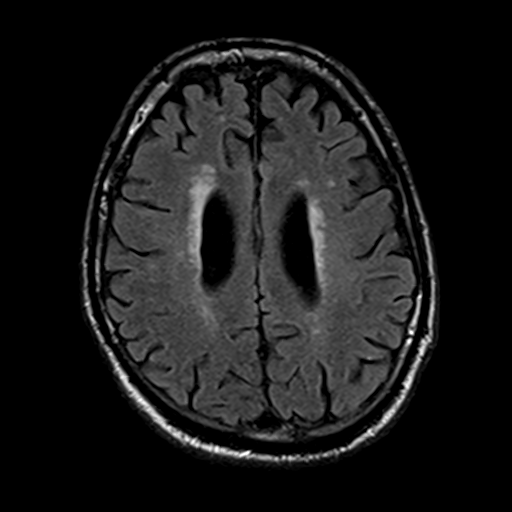
[im 42/42]
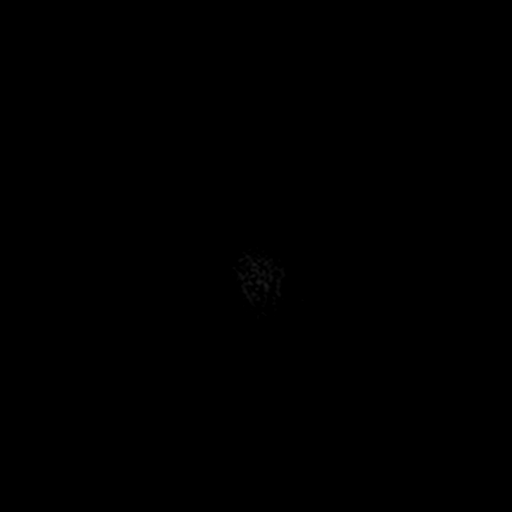

[Series 10: T2 · coronal · 5.0mm · 0.69mm/px · 3 of 30 slices shown (3 of 3)]
[im 1/30]
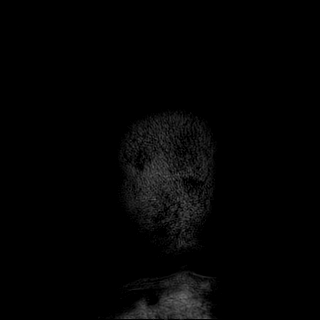
[im 15/30]
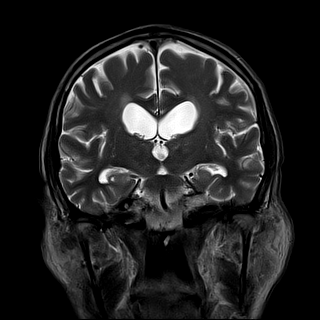
[im 30/30]
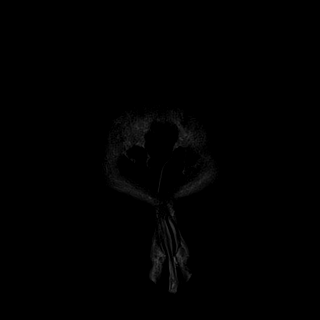

[Series 12: T1 · coronal · 5.0mm · 0.86mm/px · 3 of 30 slices shown (2 of 2)]
[im 1/30]
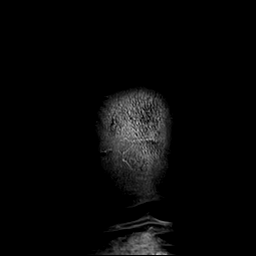
[im 15/30]
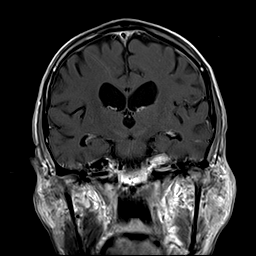
[im 30/30]
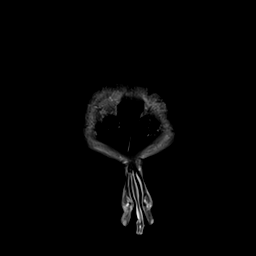

[36 of 48 positions shown; findings below may reference images not displayed]

FINDINGS: Brain: No restricted diffusion to suggest acute infarction. No
midline shift, mass effect, evidence of mass lesion,
ventriculomegaly, extra-axial collection or acute intracranial
hemorrhage. Cervicomedullary junction and pituitary are within
normal limits. Stable cerebral volume.

Largely stable gray and white matter signal throughout the brain
over this series of exams. There has been mild progression of
scattered nonspecific cerebral white matter T2 and FLAIR
hyperintensity since the 4526 MRI. Chronic T2 heterogeneity in the
deep gray matter nuclei appears mostly due to perivascular spaces.
Patchy T2 hyperintensity in the pons is stable. No cortical
encephalomalacia. Several chronic micro hemorrhages (left parietal
lobe series 7, image 20 - and also in both hippocampal formations
better demonstrated on prior susceptibility weighted imaging)
appears stable. No abnormal enhancement identified. No dural
thickening.

Vascular: Major intracranial vascular flow voids are stable. There
is mild generalized chronic intracranial artery tortuosity. The
major dural venous sinuses are enhancing and appear patent.

Skull and upper cervical spine: Negative visible cervical spine.
Bone marrow signal remains normal.

Sinuses/Orbits: Stable orbits with postoperative changes to the left
globe. Paranasal Visualized paranasal sinuses and mastoids are
stable and well pneumatized.

Other: Visible internal auditory structures appear normal. Scalp and
face soft tissues appear negative.
IMPRESSION: No acute intracranial abnormality and stable MRI appearance of the
brain since 5415. Moderate for age scattered signal changes in both
hemispheres and the pons compatible with chronic small vessel
disease.

## 2020-01-18 ENCOUNTER — Ambulatory Visit: Payer: Medicare PPO | Attending: Internal Medicine

## 2020-01-18 DIAGNOSIS — Z23 Encounter for immunization: Secondary | ICD-10-CM | POA: Insufficient documentation

## 2020-01-18 NOTE — Progress Notes (Signed)
   Covid-19 Vaccination Clinic  Name:  Joshua Carney    MRN: [id number] DOB: [birth year]  01/18/2020  Joshua Carney was observed post Covid-19 immunization for 15 minutes without incidence. He was provided with Vaccine Information Sheet and instruction to access the V-Safe system.   Joshua Carney was instructed to call 911 with any severe reactions post vaccine: Marland Kitchen Difficulty breathing  . Swelling of your face and throat  . A fast heartbeat  . A bad rash all over your body  . Dizziness and weakness    Immunizations Administered    Name Date Dose VIS Date Route   Pfizer COVID-19 Vaccine 01/18/2020 10:52 AM 0.3 mL 12/10/2019 Intramuscular   Manufacturer: [employer]   Lot: [id number]   [place]: [id number]

## 2020-02-08 ENCOUNTER — Ambulatory Visit: Payer: Medicare PPO | Attending: Internal Medicine

## 2020-02-08 DIAGNOSIS — Z23 Encounter for immunization: Secondary | ICD-10-CM | POA: Insufficient documentation

## 2020-02-08 NOTE — Progress Notes (Signed)
   Covid-19 Vaccination Clinic  Name:  Joshua Carney    MRN: [id number] DOB: 18-Mar-1934  02/08/2020  [name] was observed post Covid-19 immunization for 15 minutes without incidence. He was provided with Vaccine Information Sheet and instruction to access the V-Safe system.   [name] was instructed to call 911 with any severe reactions post vaccine: Marland Kitchen Difficulty breathing  . Swelling of your face and throat  . A fast heartbeat  . A bad rash all over your body  . Dizziness and weakness    Immunizations Administered    Name Date Dose VIS Date Route   Pfizer COVID-19 Vaccine 02/08/2020 10:26 AM 0.3 mL 12/10/2019 Intramuscular   Manufacturer: [place]   Lot: [id number]   [place]: [id number]

## 2020-03-24 ENCOUNTER — Other Ambulatory Visit: Payer: Self-pay | Admitting: Cardiology

## 2020-03-27 ENCOUNTER — Other Ambulatory Visit: Payer: Self-pay

## 2020-03-27 ENCOUNTER — Other Ambulatory Visit (HOSPITAL_COMMUNITY): Payer: Self-pay | Admitting: Family Medicine

## 2020-03-27 ENCOUNTER — Ambulatory Visit (HOSPITAL_COMMUNITY)
Admission: RE | Admit: 2020-03-27 | Discharge: 2020-03-27 | Disposition: A | Discharge status: Home / Self Care | Payer: Medicare PPO | Source: Ambulatory Visit | Attending: Surgery | Admitting: Surgery

## 2020-03-27 DIAGNOSIS — I6522 Occlusion and stenosis of left carotid artery: Secondary | ICD-10-CM

## 2020-04-03 ENCOUNTER — Ambulatory Visit: Payer: Medicare PPO

## 2020-04-10 ENCOUNTER — Other Ambulatory Visit: Payer: Self-pay

## 2020-04-10 ENCOUNTER — Ambulatory Visit: Payer: Medicare PPO | Admitting: Physician Assistant

## 2020-04-10 VITALS — BP 164/81 | HR 69 | Temp 97.7°F | Ht 69.0 in | Wt 193.0 lb

## 2020-04-10 DIAGNOSIS — I6522 Occlusion and stenosis of left carotid artery: Secondary | ICD-10-CM

## 2020-04-10 NOTE — Progress Notes (Signed)
Office Note     CC:  follow up Requesting Provider:  Derinda Late, MD  HPI: Joshua Carney is a 86 y.o. (05-02-1934) male who presents for follow up of carotid stenosis. He has history of transient left leg weakness in April of 2019. He has had no further symptoms since his last visit in June of 2019. His PCP ordered a carotid ultrasound and due to findings of some carotid stenosis on duplex evaluation he requested that he follow up with vascular. That is why he is here today.  He denies any previous history of stroke, TIAs, expressive or receptive aphasia, or amaurosis fugax.  He has a history of atrial fibrillation and is on Eliquis.  He does not take aspirin. He does not take a statin due to concern about side effects  He was hospitalized in August 2020 after a fall that resulted in a left femoral fracture of which he had to undergo a ORIF. He otherwise denies any new medical conditions since last visit in June 2019  He denies any claudication symptoms, rest pain or non healing wounds  The pt is not on a statin for cholesterol management.  The pt is not on a daily aspirin.   Other AC:  Eliquis The pt is on ARB and CCB for hypertension.   The pt is not diabetic.  Tobacco hx: Former, quit in 1965  Past Medical History:  Diagnosis Date  . Anticoagulant long-term use    Eliquis  . Benign localized prostatic hyperplasia with lower urinary tract symptoms (LUTS)   . Chronic systolic (congestive) heart failure ([place])    followed by dr hochrein  . Coronary artery disease    cardiologist-  dr hochrein--  per cardiac cath 01-21-2013  non-obstructive (LAD 25%,  Diagonal 25%,  RI 50%, AV groove 95%, pRCA 30%,  PDA ostial 30%,  ef 55%)   . Degenerative disc disease, cervical   . ED (erectile dysfunction)   . Hearing loss   . History of diabetes mellitus    hx of diabetes , per pt lost alot of weight no longer take medication and longer dx as diabetic  . History of kidney stones   .  History of skin cancer   . Hypertension   . Hypothyroidism   . Insomnia   . Internal hemorrhoids   . Left bundle branch block   . Left cervical radiculopathy   . Mild atherosclerosis of carotid artery, bilateral    last duplex 06-02-2018,  bilateral ICA 1-39%  . Mixed hyperlipidemia   . Nonischemic cardiomyopathy ([place])    ( echo 11/ 2013 , ef 35-40%)  last echo 04-29-2017,  ef 50-55%  . Osteoarthritis    right knee  . PAF (paroxysmal atrial fibrillation) ([place]) 03/2017   cardiologist-- dr Percival Spanish  . Peripheral neuropathy   . Wears glasses     Past Surgical History:  Procedure Laterality Date  . CATARACT EXTRACTION W/ INTRAOCULAR LENS IMPLANT Left 2017  . CONVERSION TO TOTAL HIP Right 12/24/2018   Procedure: CONVERSION TO RIGHT ANTERIOR TOTAL HIP;  Surgeon: Paralee Cancel, MD;  Location: [facility];  Service: Orthopedics;  Laterality: Right;  90 mins  . HAND SURGERY  teen   pinning left hand fracture  . [place];  05-20-2003  dr Zella Richer  . HIP PINNING,CANNULATED Right 09/22/2018   Procedure: RIGHT HIP CLOSED REDUCITON WITH PERCUATANEOUS SCREW FIXATION;  Surgeon: Paralee Cancel, MD;  Location: [facility];  Service: Orthopedics;  Laterality: Right;  90 mins  . KNEE ARTHROSCOPY Left 1998  . LEFT HEART CATHETERIZATION WITH CORONARY ANGIOGRAM N/A 01/21/2013   Procedure: LEFT HEART CATHETERIZATION WITH CORONARY ANGIOGRAM;  Surgeon: [name], MD;  Location: [facility] CATH LAB;  Service: Cardiovascular;  Laterality: N/A;  . [place]   L2-3  . TONSILLECTOMY AND ADENOIDECTOMY  child  . TOTAL HIP ARTHROPLASTY Left 07/30/2019   Procedure: TOTAL HIP ARTHROPLASTY ANTERIOR APPROACH;  Surgeon: Paralee Cancel, MD;  Location: [facility];  Service: Orthopedics;  Laterality: Left;  . TOTAL KNEE ARTHROPLASTY Left 12-16-2006    dr Theda Sers  @[facility]     Social History   Socioeconomic History  . Marital status: Married    Spouse name: Not on file  . Number of children: 2  . Years  of education: Not on file  . Highest education level: Not on file  Occupational History  . Not on file  Tobacco Use  . Smoking status: Former Smoker    Years: 8.00    Types: Cigarettes    Quit date: 12/17/1964    Years since quitting: 55.3  . Smokeless tobacco: Never Used  Substance and Sexual Activity  . Alcohol use: Yes    Comment: seldom  . Drug use: Never  . Sexual activity: Not on file    Comment: vasectomy  Other Topics Concern  . Not on file  Social History Narrative   Three grandchildren.  Lives at home with wife.    Social Determinants of Health   Financial Resource Strain:   . Difficulty of Paying Living Expenses:   Food Insecurity:   . Worried About Charity fundraiser in the Last Year:   . Arboriculturist in the Last Year:   Transportation Needs:   . Film/video editor (Medical):   Marland Kitchen Lack of Transportation (Non-Medical):   Physical Activity:   . Days of Exercise per Week:   . Minutes of Exercise per Session:   Stress:   . Feeling of Stress :   Social Connections:   . Frequency of Communication with Friends and Family:   . Frequency of Social Gatherings with Friends and Family:   . Attends Religious Services:   . Active Member of Clubs or Organizations:   . Attends Archivist Meetings:   Marland Kitchen Marital Status:   Intimate Partner Violence:   . Fear of Current or Ex-Partner:   . Emotionally Abused:   Marland Kitchen Physically Abused:   . Sexually Abused:     Family History  Problem Relation Age of Onset  . Hypertension Mother   . Hypertension Father   . Gallbladder disease Father   . Atrial fibrillation Sister   . High blood pressure Sister     Current Outpatient Medications  Medication Sig Dispense Refill  . ALPRAZolam (XANAX) 0.5 MG tablet Take 0.5 mg by mouth at bedtime.     Marland Kitchen amLODipine (NORVASC) 5 MG tablet Take 5 mg by mouth every morning.     . B Complex Vitamins (B COMPLEX 100 PO) Take 100 mg by mouth daily.    . bisacodyl (DULCOLAX) 10 MG  suppository Place 1 suppository (10 mg total) rectally daily as needed for moderate constipation. 12 suppository 0  . Cholecalciferol (VITAMIN D3) 50 MCG (2000 UT) TABS Take 2,000 Units by mouth daily.     . Coenzyme Q10 (EQL COQ10) 300 MG CAPS Take 300 mg by mouth daily.     Marland Kitchen docusate sodium (COLACE) 100 MG capsule Take  1 capsule (100 mg total) by mouth 2 (two) times daily. (Patient taking differently: Take 100 mg by mouth daily as needed. ) 28 capsule 0  . ELIQUIS 5 MG TABS tablet TAKE 1 TABLET(5 MG) BY MOUTH TWICE DAILY 180 tablet 1  . Evening Primrose Oil 1000 MG CAPS Take 1,000 mg by mouth daily.    . hydroxypropyl methylcellulose / hypromellose (ISOPTO TEARS / GONIOVISC) 2.5 % ophthalmic solution Place 1 drop into both eyes daily as needed for dry eyes.     Marland Kitchen levothyroxine (SYNTHROID, LEVOTHROID) 75 MCG tablet Take 37.5 mcg by mouth See admin instructions. Take 37.5 mcg by mouth daily in the evening except do NOT take on Sunday    . losartan (COZAAR) 100 MG tablet Take 100 mg by mouth daily.  3  . MAGNESIUM-OXIDE PO Take 600 mg by mouth daily.    . Melatonin 3 MG TABS Take 3 mg by mouth at bedtime.     . Multiple Vitamins-Minerals (PRESERVISION AREDS 2 PO) Take 1 capsule by mouth 2 (two) times daily.    . nitroGLYCERIN (NITROSTAT) 0.4 MG SL tablet PLACE 1 TABLET UNDER THE TONGUE EVERY 5 MINUTES AS NEEDED FOR CHEST PAIN (Patient taking differently: Place 0.4 mg under the tongue every 5 (five) minutes as needed. ) 25 tablet 3  . polyethylene glycol (MIRALAX / GLYCOLAX) 17 g packet Take 17 g by mouth 2 (two) times daily. 28 packet 0  . traZODone (DESYREL) 50 MG tablet Take 50 mg by mouth at bedtime.     No current facility-administered medications for this visit.    No Known Allergies   REVIEW OF SYSTEMS:  Review of Systems  Constitutional: Negative for chills, fever and malaise/fatigue.  Eyes: Negative for double vision.  Respiratory: Negative for cough and shortness of breath.     Cardiovascular: Negative for chest pain, palpitations and leg swelling.  Gastrointestinal: Negative for abdominal pain, constipation, diarrhea, heartburn, nausea and vomiting.  Genitourinary: Negative for dysuria.  Musculoskeletal: Negative for myalgias.  Neurological: Negative for dizziness, focal weakness and headaches.  Endo/Heme/Allergies: Bruises/bleeds easily (secondary to Eliquis).     PHYSICAL EXAMINATION:  Vitals:   04/10/20 1412 04/10/20 1413  BP: (!) 176/81 (!) 164/81  Pulse: 69   Temp: 97.7 F (36.5 C)   TempSrc: Temporal   SpO2: 96%   Weight: 193 lb (87.5 kg)   Height: 5\' 9"  (1.753 m)     General: very pleasant, well nourished, not in any discomfort Gait: ambulates with a cane HENT: WNL, normocephalic Pulmonary: normal non-labored breathing , without wheezing Cardiac: irregular HR, without  Murmurs without carotid bruit Abdomen: soft, NT, no masses Skin: without rashes Vascular Exam/Pulses: 2+ femoral pulses bilaterally, 2+ DP/PT pulses bilaterally. Bilateral feet warm Extremities: without ischemic changes, without Gangrene , without cellulitis; without open wounds;  Musculoskeletal: no muscle wasting or atrophy  Neurologic: A&O X 3;  No focal weakness or paresthesias are detected Psychiatric:  The pt has Normal affect.   Non-Invasive Vascular Imaging:   VAS US Carotid Duplex Bilateral 03/27/20 Summary:  Right Carotid: Velocities in the right ICA are consistent with a 1-39% stenosis. Non-hemodynamically significant plaque <50% noted in the CCA. The ECA appears <50% stenosed.   Left Carotid: Velocities in the left ICA are consistent with a 40-59% stenosis. Non-hemodynamically significant plaque <50% noted in the CCA. The ECA appears <50% stenosed.   Vertebrals: Bilateral vertebral arteries demonstrate antegrade flow.  Subclavians: Normal flow hemodynamics were seen in bilateral subclavian arteries.  ASSESSMENT/PLAN:: 86 y.o. male here for follow up for  carotid stenosis. He remains without symptoms of stroke or TIA. He had very slight change from 1-39% to 40-59% left ICA stenosis from prior duplex in 2019. I discussed importance of good blood pressure control. He is not on aspirin due to being on Eliquis per his Cardiologist. I re discussed roll of statin therapy. He would like to think about it and discuss it further with his PCP. He otherwise is very active. He does not need any surgical intervention at this time. He will need a follow up with a carotid duplex ultrasound in 1 year   Karoline Caldwell, PA-C [facility] [phone]  Clinic MD: Dr. Trula Slade

## 2020-04-11 ENCOUNTER — Other Ambulatory Visit: Payer: Self-pay | Admitting: *Deleted

## 2020-04-11 DIAGNOSIS — I6523 Occlusion and stenosis of bilateral carotid arteries: Secondary | ICD-10-CM

## 2020-05-10 ENCOUNTER — Other Ambulatory Visit: Payer: Self-pay | Admitting: Family Medicine

## 2020-05-10 DIAGNOSIS — Z8781 Personal history of (healed) traumatic fracture: Secondary | ICD-10-CM

## 2020-05-16 ENCOUNTER — Telehealth: Payer: Self-pay | Admitting: Cardiology

## 2020-05-16 NOTE — Telephone Encounter (Signed)
Called patient twice 05/16/20 (second call per patient's request) to get scheduled for appointment, patient wants return call for appointment 05/17/20

## 2020-06-21 ENCOUNTER — Other Ambulatory Visit: Payer: Self-pay | Admitting: *Deleted

## 2020-06-21 DIAGNOSIS — I480 Paroxysmal atrial fibrillation: Secondary | ICD-10-CM

## 2020-06-22 ENCOUNTER — Encounter: Payer: Self-pay | Admitting: *Deleted

## 2020-06-22 ENCOUNTER — Telehealth: Payer: Self-pay | Admitting: *Deleted

## 2020-06-22 NOTE — Progress Notes (Signed)
Patient ID: Joshua Carney, male   DOB: 01/27/1934, 86 y.o.   MRN: [id number] Patient enrolled for [employer] to ship a 30 day cardiac event monitor to his home.  Letter with instructions mailed to patient.

## 2020-06-22 NOTE — Telephone Encounter (Signed)
Patient called to inform him Sherri at Dr. Estill Bamberg office stated he will only need to wear the cardiac event monitor for 14 days.

## 2020-06-27 ENCOUNTER — Telehealth: Payer: Self-pay | Admitting: Cardiology

## 2020-06-27 MED ORDER — ELIQUIS 5 MG PO TABS: 5.0000 mg | ORAL_TABLET | Freq: Two times a day (BID) | ORAL | 1 refills | Status: AC

## 2020-06-27 NOTE — Telephone Encounter (Signed)
*  STAT* If patient is at the pharmacy, call can be transferred to refill team.   1. Which medications need to be refilled? (please list name of each medication and dose if known)   ELIQUIS 5 MG TABS tablet    2. Which pharmacy/location (including street and city if local pharmacy) is medication to be sent to? [id number] - [place], [place]. AT [address]  3. Do they need a 30 day or 90 day supply? 90 day supply

## 2020-06-27 NOTE — Telephone Encounter (Signed)
86yo 87.5kg Last Visit 08/12/2019  Scr = 1.015 04/26/2020 per [facility]

## 2020-07-03 ENCOUNTER — Encounter: Payer: Self-pay | Admitting: Cardiology

## 2020-07-03 ENCOUNTER — Ambulatory Visit (INDEPENDENT_AMBULATORY_CARE_PROVIDER_SITE_OTHER): Payer: Medicare PPO

## 2020-07-03 DIAGNOSIS — I480 Paroxysmal atrial fibrillation: Secondary | ICD-10-CM

## 2020-07-10 ENCOUNTER — Telehealth: Payer: Self-pay

## 2020-07-10 NOTE — Telephone Encounter (Signed)
Received strip from monitor ordered by Dr. Sandi Mariscal.  Pt had episode of afib with heart rates 140/150.  Pt states Dr. Sandi Mariscal ordered the monitor because Pt has felt more fatigued over last 6 weeks.  Pt has known PAF on Eliquis.  Advised Pt that monitor will determine how much afib Pt is having, and based on complete report will make advisement.  Pt thanked nurse for call back.

## 2020-08-03 ENCOUNTER — Telehealth: Payer: Self-pay | Admitting: *Deleted

## 2020-08-03 NOTE — Telephone Encounter (Signed)
-----   Message from [name], MD sent at 07/30/2020 11:06 AM EDT ----- I read an event monitor for this patient that I believe was ordered by Dr. Sandi Mariscal.  Please make sure that he received this result.  Thanks.

## 2020-08-03 NOTE — Telephone Encounter (Signed)
Confirmed monitor received

## 2020-08-04 ENCOUNTER — Other Ambulatory Visit: Payer: Self-pay | Admitting: Family Medicine

## 2020-08-04 DIAGNOSIS — R748 Abnormal levels of other serum enzymes: Secondary | ICD-10-CM

## 2020-08-15 ENCOUNTER — Ambulatory Visit
Admission: RE | Admit: 2020-08-15 | Discharge: 2020-08-15 | Disposition: A | Discharge status: Home / Self Care | Payer: Medicare PPO | Source: Ambulatory Visit | Attending: Family Medicine | Admitting: Family Medicine

## 2020-08-15 ENCOUNTER — Other Ambulatory Visit: Payer: Self-pay | Admitting: Family Medicine

## 2020-08-15 DIAGNOSIS — R0989 Other specified symptoms and signs involving the circulatory and respiratory systems: Secondary | ICD-10-CM

## 2020-08-15 DIAGNOSIS — R5383 Other fatigue: Secondary | ICD-10-CM

## 2020-08-17 ENCOUNTER — Other Ambulatory Visit: Payer: Self-pay

## 2020-08-17 ENCOUNTER — Ambulatory Visit
Admission: RE | Admit: 2020-08-17 | Discharge: 2020-08-17 | Disposition: A | Discharge status: Home / Self Care | Payer: Medicare PPO | Source: Ambulatory Visit | Attending: Family Medicine | Admitting: Family Medicine

## 2020-08-17 ENCOUNTER — Other Ambulatory Visit: Payer: Self-pay | Admitting: Family Medicine

## 2020-08-17 DIAGNOSIS — R748 Abnormal levels of other serum enzymes: Secondary | ICD-10-CM

## 2020-08-17 DIAGNOSIS — Z7189 Other specified counseling: Secondary | ICD-10-CM | POA: Insufficient documentation

## 2020-08-17 MED ORDER — IOPAMIDOL (ISOVUE-300) INJECTION 61%
100.0000 mL | Freq: Once | INTRAVENOUS | Status: AC | PRN
  Administered 2020-08-17: 100 mL via INTRAVENOUS

## 2020-08-17 NOTE — Progress Notes (Signed)
Cardiology Office Note   Date:  08/18/2020   ID:  [name], [name] 1934-11-01, MRN [id number]  PCP:  Derinda Late, MD  Cardiologist:   [name], MD   Chief Complaint  Patient presents with  . Atrial Fibrillation      History of Present Illness: Joshua Carney is a 86 y.o. male who presents for evaluation of atrial fib.  Previously he had a cardiomyopathy with left bundle branch block.  Cath demonstrated 25% LAD stenosis, 25% diagonal stenosis, 50% ramus intermediate stenosis, 95% stenosis in the AV groove leading into a possibly moderate sized marginal. The right coronary artery proximal 30% stenosis. The PDA had ostial 30% stenosis. The EF appeared to be about 55% on cath.  He was hospitalized in April of 2018 with atrial fib.  He was treated with rate control and started on Eliquis.  Echo showed normal EF.  He had a CT which suggested severe stenosis of the left internal carotid artery.  However, Doppler suggested mild stenosis.  Since I last saw him he wore a monitor and had PAF with 3 second post termination pauses.    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  He has been progressively fatigued.  He has been progressively dyspneic with mild exertion.  He is not having any presyncope or syncope.  He is not having any chest pressure, neck or arm discomfort.  Past Medical History:  Diagnosis Date  . Anticoagulant long-term use    Eliquis  . Benign localized prostatic hyperplasia with lower urinary tract symptoms (LUTS)   . Chronic systolic (congestive) heart failure ([place])    followed by dr Dejane Scheibe  . Coronary artery disease    cardiologist-  dr Nijah Tejera--  per cardiac cath 01-21-2013  non-obstructive (LAD 25%,  Diagonal 25%,  RI 50%, AV groove 95%, pRCA 30%,  PDA ostial 30%,  ef 55%)   . Degenerative disc disease, cervical   . ED (erectile dysfunction)   .  Hearing loss   . History of diabetes mellitus    hx of diabetes , per pt lost alot of weight no longer take medication and longer dx as diabetic  . History of kidney stones   . History of skin cancer   . Hypertension   . Hypothyroidism   . Insomnia   . Internal hemorrhoids   . Left bundle branch block   . Left cervical radiculopathy   . Mild atherosclerosis of carotid artery, bilateral    last duplex 06-02-2018,  bilateral ICA 1-39%  . Mixed hyperlipidemia   . Nonischemic cardiomyopathy ([place])    ( echo 11/ 2013 , ef 35-40%)  last echo 04-29-2017,  ef 50-55%  . Osteoarthritis    right knee  . PAF (paroxysmal atrial fibrillation) ([place]) 03/2017   cardiologist-- dr Percival Spanish  . Peripheral neuropathy   . Wears glasses     Past Surgical History:  Procedure Laterality Date  . CATARACT EXTRACTION W/ INTRAOCULAR LENS IMPLANT Left 2017  . CONVERSION TO TOTAL HIP Right 12/24/2018   Procedure: CONVERSION TO RIGHT ANTERIOR TOTAL HIP;  Surgeon: Paralee Cancel, MD;  Location: [facility];  Service: Orthopedics;  Laterality: Right;  90 mins  . HAND SURGERY  teen   pinning left hand fracture  . [place];  05-20-2003  dr Zella Richer  . HIP PINNING,CANNULATED Right 09/22/2018   Procedure:  RIGHT HIP CLOSED REDUCITON WITH PERCUATANEOUS SCREW FIXATION;  Surgeon: Paralee Cancel, MD;  Location: [facility];  Service: Orthopedics;  Laterality: Right;  90 mins  . KNEE ARTHROSCOPY Left 1998  . LEFT HEART CATHETERIZATION WITH CORONARY ANGIOGRAM N/A 01/21/2013   Procedure: LEFT HEART CATHETERIZATION WITH CORONARY ANGIOGRAM;  Surgeon: [name], MD;  Location: [facility] CATH LAB;  Service: Cardiovascular;  Laterality: N/A;  . [place]   L2-3  . TONSILLECTOMY AND ADENOIDECTOMY  child  . TOTAL HIP ARTHROPLASTY Left 07/30/2019   Procedure: TOTAL HIP ARTHROPLASTY ANTERIOR APPROACH;  Surgeon: Paralee Cancel, MD;  [facility];  Service: Orthopedics;  Laterality: Left;  . TOTAL KNEE  ARTHROPLASTY Left 12-16-2006    dr Theda Sers  @[facility]      Current Outpatient Medications  Medication Sig Dispense Refill  . ALPRAZolam (XANAX) 0.5 MG tablet Take 0.5 mg by mouth at bedtime.     Marland Kitchen amLODipine (NORVASC) 5 MG tablet Take 5 mg by mouth every morning.     . B Complex Vitamins (B COMPLEX 100 PO) Take 100 mg by mouth daily.    . bisacodyl (DULCOLAX) 10 MG suppository Place 1 suppository (10 mg total) rectally daily as needed for moderate constipation. 12 suppository 0  . Cholecalciferol (VITAMIN D3) 50 MCG (2000 UT) TABS Take 2,000 Units by mouth daily.     . Coenzyme Q10 (EQL COQ10) 300 MG CAPS Take 300 mg by mouth daily.     Marland Kitchen docusate sodium (COLACE) 100 MG capsule Take 1 capsule (100 mg total) by mouth 2 (two) times daily. (Patient taking differently: Take 100 mg by mouth daily as needed. ) 28 capsule 0  . ELIQUIS 5 MG TABS tablet Take 1 tablet (5 mg total) by mouth 2 (two) times daily. 180 tablet 1  . Evening Primrose Oil 1000 MG CAPS Take 1,000 mg by mouth daily.    . finasteride (PROSCAR) 5 MG tablet Take 1 tablet by mouth daily.    . hydroxypropyl methylcellulose / hypromellose (ISOPTO TEARS / GONIOVISC) 2.5 % ophthalmic solution Place 1 drop into both eyes daily as needed for dry eyes.     Marland Kitchen levothyroxine (SYNTHROID) 75 MCG tablet Take 37.5 mcg by mouth daily before breakfast.    . losartan (COZAAR) 100 MG tablet Take 100 mg by mouth daily.  3  . MAGNESIUM-OXIDE PO Take 600 mg by mouth daily.    . Melatonin 3 MG TABS Take 3 mg by mouth at bedtime.     . Multiple Vitamins-Minerals (PRESERVISION AREDS 2 PO) Take 1 capsule by mouth 2 (two) times daily.    . nitroGLYCERIN (NITROSTAT) 0.4 MG SL tablet PLACE 1 TABLET UNDER THE TONGUE EVERY 5 MINUTES AS NEEDED FOR CHEST PAIN (Patient taking differently: Place 0.4 mg under the tongue every 5 (five) minutes as needed. ) 25 tablet 3  . omeprazole (PRILOSEC) 20 MG capsule Take 1 capsule by mouth as needed.    . polyethylene glycol (MIRALAX  / GLYCOLAX) 17 g packet Take 17 g by mouth 2 (two) times daily. 28 packet 0  . prednisoLONE acetate (PRED FORTE) 1 % ophthalmic suspension Place 1 drop into both eyes as needed.    . tamsulosin (FLOMAX) 0.4 MG CAPS capsule Take 1 capsule by mouth daily.    . traZODone (DESYREL) 50 MG tablet Take 50 mg by mouth at bedtime.     No current facility-administered medications for this visit.    Allergies:   Patient has no known allergies.  ROS:  Please see the history of present illness.   Otherwise, review of systems are positive for none.   All other systems are reviewed and negative.    PHYSICAL EXAM: VS:  BP (!) 132/54   Pulse 80   Ht 5\' 8"  (1.727 m)   Wt 182 lb (82.6 kg)   SpO2 97%   BMI 27.67 kg/m  , BMI Body mass index is 27.67 kg/m. GENERAL:  Well appearing, jaundiced NECK:  No jugular venous distention, waveform within normal limits, carotid upstroke brisk and symmetric, no bruits, no thyromegaly LUNGS:  Clear to auscultation bilaterally CHEST:  Unremarkable HEART:  PMI not displaced or sustained,S1 and S2 within normal limits, no S3, no S4, no clicks, no rubs, no murmurs ABD:  Flat, positive bowel sounds normal in frequency in pitch, no bruits, no rebound, no guarding, no midline pulsatile mass, no hepatomegaly, no splenomegaly EXT:  2 plus pulses throughout, no edema, no cyanosis no clubbing    EKG:  EKG is ordered today. The ekg ordered today demonstrates sinus rhythm, rate 68, left bundle branch block, left axis deviation   Recent Labs: No results found for requested labs within last 8760 hours.    Lipid Panel    Component Value Date/Time   CHOL 120 04/29/2017 0254   TRIG 42 04/29/2017 0254   HDL 38 (L) 04/29/2017 0254   CHOLHDL 3.2 04/29/2017 0254   VLDL 8 04/29/2017 0254   LDLCALC 74 04/29/2017 0254      Wt Readings from Last 3 Encounters:  08/18/20 182 lb (82.6 kg)  04/10/20 193 lb (87.5 kg)  08/12/19 182 lb (82.6 kg)      Other studies  Reviewed: Additional studies/ records that were reviewed today include: CT. Review of the above records demonstrates:  Please see elsewhere in the note.     ASSESSMENT AND PLAN:  ATRIAL FIB: The patient has documented atrial fibrillation. Joshua Carney a CHA2DS2 - VASc score of 5.   Of note the patient can hold his Eliquis for 4 doses prior to any planned procedures should he need invasive work-up of the pancreatic mass.   PREOP: In anticipation that he might need an invasive procedure or surgery this note will serve as his preoperative evaluation.  He has no high risk findings.  He has been functional.  Therefore, no further cardiovascular testing would be needed prior to a procedure.  CAD:The patient no new symptoms.  No change in therapy.   CARDIOMYOPATHY: EF was 55% on the most recent echo.  No change in therapy.  HTN: The blood pressure is  at target.  No change in therapy.    HYPERLIPIDEMIA:   I will defer to Dr.Blomgren, Collier Salina, MD   CAROTID STENOSIS:This was mild previously no change in therapy.  COVID EDUCATION: He has been vaccinated.  Current medicines are reviewed at length with the patient today.  The patient does not have concerns regarding medicines.  The following changes have been made:  no change  Labs/ tests ordered today include: None  Orders Placed This Encounter  Procedures  . EKG 12-Lead     Disposition:   FU with me in six months.     Signed, [name], MD  08/18/2020 9:17 AM    [facility]

## 2020-08-18 ENCOUNTER — Ambulatory Visit: Payer: Medicare PPO | Admitting: Cardiology

## 2020-08-18 ENCOUNTER — Other Ambulatory Visit: Payer: Self-pay

## 2020-08-18 ENCOUNTER — Encounter: Payer: Self-pay | Admitting: Cardiology

## 2020-08-18 VITALS — BP 132/54 | HR 80 | Ht 68.0 in | Wt 182.0 lb

## 2020-08-18 DIAGNOSIS — E785 Hyperlipidemia, unspecified: Secondary | ICD-10-CM | POA: Diagnosis not present

## 2020-08-18 DIAGNOSIS — I10 Essential (primary) hypertension: Secondary | ICD-10-CM

## 2020-08-18 DIAGNOSIS — I4891 Unspecified atrial fibrillation: Secondary | ICD-10-CM

## 2020-08-18 DIAGNOSIS — I2510 Atherosclerotic heart disease of native coronary artery without angina pectoris: Secondary | ICD-10-CM

## 2020-08-18 DIAGNOSIS — Z7189 Other specified counseling: Secondary | ICD-10-CM

## 2020-08-18 NOTE — Patient Instructions (Signed)
Medication Instructions:  No changes *If you need a refill on your cardiac medications before your next appointment, please call your pharmacy*  Lab Work: None ordered this visit  Testing/Procedures: None ordered this visit  Follow-Up: At [facility], you and your health needs are our priority.  As part of our continuing mission to provide you with exceptional heart care, we have created designated Provider Care Teams.  These Care Teams include your primary Cardiologist (physician) and Advanced Practice Providers (APPs -  Physician Assistants and Nurse Practitioners) who all work together to provide you with the care you need, when you need it.  We recommend signing up for the patient portal called "MyChart".  Sign up information is provided on this After Visit Summary.  MyChart is used to connect with patients for Virtual Visits (Telemedicine).  Patients are able to view lab/test results, encounter notes, upcoming appointments, etc.  Non-urgent messages can be sent to your provider as well.   To learn more about what you can do with MyChart, go to [email or web address].    Your next appointment:   6 month(s)  You will receive a reminder letter in the mail two months in advance. If you don't receive a letter, please call our office to schedule the follow-up appointment.  The format for your next appointment:   In Person  Provider:   [name], MD

## 2020-08-22 ENCOUNTER — Ambulatory Visit
Admission: RE | Admit: 2020-08-22 | Discharge: 2020-08-22 | Disposition: A | Discharge status: Home / Self Care | Payer: Medicare PPO | Source: Ambulatory Visit | Attending: Family Medicine | Admitting: Family Medicine

## 2020-08-22 DIAGNOSIS — R748 Abnormal levels of other serum enzymes: Secondary | ICD-10-CM

## 2020-09-07 ENCOUNTER — Other Ambulatory Visit: Payer: Medicare PPO

## 2020-09-29 ENCOUNTER — Telehealth: Payer: Self-pay | Admitting: Cardiology

## 2020-09-29 NOTE — Telephone Encounter (Signed)
Dr Percival Spanish spoke to  Dr Julaine Hua

## 2020-09-29 NOTE — Telephone Encounter (Signed)
   Dr. Julaine Hua from [facility] would like to speak with Dr. Percival Spanish, transferred call to [place]

## 2020-10-05 ENCOUNTER — Other Ambulatory Visit: Payer: Medicare PPO

## 2020-10-15 ENCOUNTER — Encounter: Payer: Self-pay | Admitting: Cardiology

## 2020-10-15 NOTE — Progress Notes (Signed)
Cardiology Office Note   Date:  10/16/2020   ID:  Joshua Carney, DOB 01-Sep-1934, MRN [id number]  PCP:  Derinda Late, MD  Cardiologist:   [name], MD   Chief Complaint  Patient presents with  . Atrial Fibrillation      History of Present Illness: Joshua Carney is a 86 y.o. male who presents for evaluation of atrial fib.  Previously he had a cardiomyopathy with left bundle branch block.  Cath demonstrated 25% LAD stenosis, 25% diagonal stenosis, 50% ramus intermediate stenosis, 95% stenosis in the AV groove leading into a possibly moderate sized marginal. The right coronary artery proximal 30% stenosis. The PDA had ostial 30% stenosis. The EF appeared to be about 55% on cath.  He was hospitalized in April of 2018 with atrial fib.  He was treated with rate control and started on Eliquis.  Echo showed normal EF.  He had a CT which suggested severe stenosis of the left internal carotid artery.  However, Doppler suggested mild stenosis.  He wore a monitor and had PAF with 3 second post termination pauses.   He is being treated for pancreatic cancer.  He is receiving Gemcitabine/Abraxane and had bile duct stenting since I last saw him.  He was in the hospital earlier this month at [facility] with atrial fib with RVR.    He does not really feel his atrial fibrillation.  He was sent home on metoprolol 100 mg twice daily.  He converted spontaneously apparently after being on Cardizem.  I cannot see whether he was on amiodarone at at all but he was not sent home on this.  He does have his Apple Watch and he does not think he has been in atrial fibrillation according to this since then.  He denies any presyncope or syncope.  He said no chest pressure, neck or arm discomfort.  He has been severely constipated.  He is taking his anticoagulation.  Of note he was taken off of Losartan at the time of his hospitalization.   Past Medical History:  Diagnosis Date  . Anticoagulant long-term use     Eliquis  . Benign localized prostatic hyperplasia with lower urinary tract symptoms (LUTS)   . Chronic systolic (congestive) heart failure ([place])    followed by dr Lillyn Wieczorek  . Coronary artery disease    cardiologist-  dr Shalamar Plourde--  per cardiac cath 01-21-2013  non-obstructive (LAD 25%,  Diagonal 25%,  RI 50%, AV groove 95%, pRCA 30%,  PDA ostial 30%,  ef 55%)   . Degenerative disc disease, cervical   . ED (erectile dysfunction)   . History of diabetes mellitus    hx of diabetes , per pt lost alot of weight no longer take medication and longer dx as diabetic  . History of kidney stones   . History of skin cancer   . Hypertension   . Hypothyroidism   . Insomnia   . Internal hemorrhoids   . Left bundle branch block   . Left cervical radiculopathy   . Mild atherosclerosis of carotid artery, bilateral    last duplex 06-02-2018,  bilateral ICA 1-39%  . Mixed hyperlipidemia   . Nonischemic cardiomyopathy ([place])    ( echo 11/ 2013 , ef 35-40%)  last echo 04-29-2017,  ef 50-55%  . Osteoarthritis    right knee  . PAF (paroxysmal atrial fibrillation) ([place]) 03/2017   cardiologist-- dr Percival Spanish  . Peripheral neuropathy     Past Surgical History:  Procedure Laterality  Date  . CATARACT EXTRACTION W/ INTRAOCULAR LENS IMPLANT Left 2017  . CONVERSION TO TOTAL HIP Right 12/24/2018   Procedure: CONVERSION TO RIGHT ANTERIOR TOTAL HIP;  Surgeon: Paralee Cancel, MD;  Location: [facility];  Service: Orthopedics;  Laterality: Right;  90 mins  . HAND SURGERY  teen   pinning left hand fracture  . [place];  05-20-2003  dr Zella Richer  . HIP PINNING,CANNULATED Right 09/22/2018   Procedure: RIGHT HIP CLOSED REDUCITON WITH PERCUATANEOUS SCREW FIXATION;  Surgeon: Paralee Cancel, MD;  Location: [facility];  Service: Orthopedics;  Laterality: Right;  90 mins  . KNEE ARTHROSCOPY Left 1998  . LEFT HEART CATHETERIZATION WITH CORONARY ANGIOGRAM N/A 01/21/2013   Procedure: LEFT HEART CATHETERIZATION WITH  CORONARY ANGIOGRAM;  Surgeon: [name], MD;  Location: [facility] CATH LAB;  Service: Cardiovascular;  Laterality: N/A;  . [place]   L2-3  . TONSILLECTOMY AND ADENOIDECTOMY  child  . TOTAL HIP ARTHROPLASTY Left 07/30/2019   Procedure: TOTAL HIP ARTHROPLASTY ANTERIOR APPROACH;  Surgeon: Paralee Cancel, MD;  Location: [facility];  Service: Orthopedics;  Laterality: Left;  . TOTAL KNEE ARTHROPLASTY Left 12-16-2006    dr Theda Sers  @[facility]      Current Outpatient Medications  Medication Sig Dispense Refill  . ALPRAZolam (XANAX) 0.5 MG tablet Take 0.5 mg by mouth at bedtime.     Marland Kitchen amLODipine (NORVASC) 5 MG tablet Take 5 mg by mouth every morning.     . Cholecalciferol (VITAMIN D3) 50 MCG (2000 UT) TABS Take 2,000 Units by mouth daily.     . Coenzyme Q10 (EQL COQ10) 300 MG CAPS Take 300 mg by mouth daily.     Marland Kitchen docusate sodium (COLACE) 100 MG capsule Take 1 capsule (100 mg total) by mouth 2 (two) times daily. (Patient taking differently: Take 100 mg by mouth daily as needed. ) 28 capsule 0  . ELIQUIS 5 MG TABS tablet Take 1 tablet (5 mg total) by mouth 2 (two) times daily. 180 tablet 1  . Evening Primrose Oil 1000 MG CAPS Take 1,000 mg by mouth daily.    . finasteride (PROSCAR) 5 MG tablet Take 1 tablet by mouth daily.    . hydroxypropyl methylcellulose / hypromellose (ISOPTO TEARS / GONIOVISC) 2.5 % ophthalmic solution Place 1 drop into both eyes daily as needed for dry eyes.     Marland Kitchen levothyroxine (SYNTHROID) 75 MCG tablet Take 37.5 mcg by mouth daily before breakfast.    . MAGNESIUM-OXIDE PO Take 600 mg by mouth daily.    . Melatonin 3 MG TABS Take 3 mg by mouth at bedtime.     . metoprolol tartrate (LOPRESSOR) 25 MG tablet Take 25 mg by mouth 2 (two) times daily.    . nitroGLYCERIN (NITROSTAT) 0.4 MG SL tablet PLACE 1 TABLET UNDER THE TONGUE EVERY 5 MINUTES AS NEEDED FOR CHEST PAIN (Patient taking differently: Place 0.4 mg under the tongue every 5 (five) minutes as needed. ) 25 tablet 3  .  omeprazole (PRILOSEC) 20 MG capsule Take 1 capsule by mouth as needed.    . polyethylene glycol (MIRALAX / GLYCOLAX) 17 g packet Take 17 g by mouth 2 (two) times daily. 28 packet 0  . prednisoLONE acetate (PRED FORTE) 1 % ophthalmic suspension Place 1 drop into both eyes as needed.    . tamsulosin (FLOMAX) 0.4 MG CAPS capsule Take 1 capsule by mouth daily.    . traZODone (DESYREL) 50 MG tablet Take 50 mg by mouth at  bedtime.     No current facility-administered medications for this visit.    Allergies:   Patient has no known allergies.    ROS:  Please see the history of present illness.   Otherwise, review of systems are positive for none.   All other systems are reviewed and negative.    PHYSICAL EXAM: VS:  BP (!) 116/58   Pulse 77   Ht 5\' 8"  (1.727 m)   Wt 185 lb (83.9 kg)   SpO2 98%   BMI 28.13 kg/m  , BMI Body mass index is 28.13 kg/m. GENERAL: Frail appearing NECK:  No jugular venous distention, waveform within normal limits, carotid upstroke brisk and symmetric, no bruits, no thyromegaly LUNGS:  Clear to auscultation bilaterally CHEST: Newly placed Port-A-Cath no HEART:  PMI not displaced or sustained,S1 and S2 within normal limits, no S3, no S4, no clicks, no rubs, no murmurs ABD:  Flat, positive bowel sounds normal in frequency in pitch, no bruits, no rebound, no guarding, no midline pulsatile mass, no hepatomegaly, no splenomegaly EXT:  2 plus pulses throughout, mild bilateral ankle edema, no cyanosis no clubbing   EKG:  EKG is not ordered today.    Recent Labs: No results found for requested labs within last 8760 hours.    Lipid Panel    Component Value Date/Time   CHOL 120 04/29/2017 0254   TRIG 42 04/29/2017 0254   HDL 38 (L) 04/29/2017 0254   CHOLHDL 3.2 04/29/2017 0254   VLDL 8 04/29/2017 0254   LDLCALC 74 04/29/2017 0254      Wt Readings from Last 3 Encounters:  10/16/20 185 lb (83.9 kg)  08/18/20 182 lb (82.6 kg)  04/10/20 193 lb (87.5 kg)       Other studies Reviewed: Additional studies/ records that were reviewed today include: [facility] records.  Above records demonstrates:  Please see elsewhere in the note.     ASSESSMENT AND PLAN:  ATRIAL FIB: The patient has documented atrial fibrillation. Joshua Carney a CHA2DS2 - VASc score of 5.   He is in sinus now and can track his rhythm on his Watch.  We talked about as needed use of his beta-blocker should he go back in atrial fibrillation.  We also talked about coming back to the emergency room if he is sustained despite management.  At this point I do not think I want to start an antiarrhythmic although he might get to that point if he has recurrent sustained fibrillation particularly if it interferes with his management.   CAD:   Patient has no new symptoms.  No change in therapy.  CARDIOMYOPATHY: EF was 55% on the most recent echo.    HTN: The blood pressure is at target.  He will stay off of Cozaar.   HYPERLIPIDEMIA:   This is followed by Derinda Late, MD   COVID EDUCATION: He has been vaccinated.  Current medicines are reviewed at length with the patient today.  The patient does not have concerns regarding medicines.  The following changes have been made:  None  Labs/ tests ordered today include: None  No orders of the defined types were placed in this encounter.    Disposition:   FU with me in 3 months.     Signed, [name], MD  10/16/2020 8:47 AM    [facility]

## 2020-10-16 ENCOUNTER — Ambulatory Visit: Payer: Medicare PPO | Admitting: Cardiology

## 2020-10-16 ENCOUNTER — Telehealth: Payer: Self-pay | Admitting: Cardiology

## 2020-10-16 ENCOUNTER — Encounter: Payer: Self-pay | Admitting: Cardiology

## 2020-10-16 ENCOUNTER — Other Ambulatory Visit: Payer: Self-pay

## 2020-10-16 VITALS — BP 116/58 | HR 77 | Ht 68.0 in | Wt 185.0 lb

## 2020-10-16 DIAGNOSIS — I480 Paroxysmal atrial fibrillation: Secondary | ICD-10-CM

## 2020-10-16 NOTE — Patient Instructions (Signed)
Medication Instructions:  ?Your physician recommends that you continue on your current medications as directed. Please refer to the Current Medication list given to you today. ? ?*If you need a refill on your cardiac medications before your next appointment, please call your pharmacy* ? ? ?Follow-Up: ?At [facility], you and your health needs are our priority.  As part of our continuing mission to provide you with exceptional heart care, we have created designated Provider Care Teams.  These Care Teams include your primary Cardiologist (physician) and Advanced Practice Providers (APPs -  Physician Assistants and Nurse Practitioners) who all work together to provide you with the care you need, when you need it. ? ?We recommend signing up for the patient portal called "MyChart".  Sign up information is provided on this After Visit Summary.  MyChart is used to connect with patients for Virtual Visits (Telemedicine).  Patients are able to view lab/test results, encounter notes, upcoming appointments, etc.  Non-urgent messages can be sent to your provider as well.   ?To learn more about what you can do with MyChart, go to [email or web address].   ? ?Your next appointment:   ?3 month(s) ? ?The format for your next appointment:   ?In Person ? ?Provider:   ?James Hochrein, MD  ?

## 2020-10-16 NOTE — Telephone Encounter (Signed)
Pt c/o medication issue:  1. Name of Medication: amLODipine (NORVASC) 5 MG tablet  2. How are you currently taking this medication (dosage and times per day)? 1 tablet daily  3. Are you having a reaction (difficulty breathing--STAT)? no  4. What is your medication issue? Sherry from patient's PCP states the patient had low BP in the office today and fatigue. She states it was 108/50 today. She states Dr. Sandi Mariscal would like to know if he can stop the amlodipine to see if it helps.

## 2020-10-16 NOTE — Telephone Encounter (Signed)
Called Sherry back from patients PCP at Dr. Estill Bamberg office to inform her of Dr. Cherlyn Cushing advisement. Hochrein states it will be fine for the patient to stop Amlodipine at this time due to low BP/fatigue. Judeen Hammans verbalized understanding.

## 2020-10-16 NOTE — Telephone Encounter (Signed)
Yes, we can renew this if this is the question.  Joshua Carney

## 2020-10-16 NOTE — Telephone Encounter (Signed)
Yes.  That is fine.  Less is more in this situation.

## 2021-01-16 ENCOUNTER — Ambulatory Visit: Payer: Medicare PPO | Admitting: Cardiology

## 2021-06-15 ENCOUNTER — Telehealth: Payer: Self-pay

## 2021-06-15 NOTE — Telephone Encounter (Signed)
Spoke with patient's wife regarding scheduling a consult. She requested a call back on 06/18/21 to schedule.

## 2021-06-21 ENCOUNTER — Telehealth: Payer: Self-pay

## 2021-06-21 NOTE — Telephone Encounter (Signed)
Spoke with patient's wife Di Kindle and scheduled an in-person Palliative Consult for 07/17/21 @ [facility] screening was negative. No pets in home. Patient lives with wife.  Consent obtained; updated Outlook/Netsmart/Team List and Epic.   Family is aware they may be receiving a call from NP the day before or day of to confirm appointment.

## 2021-07-03 ENCOUNTER — Telehealth: Payer: Self-pay

## 2021-07-03 NOTE — Telephone Encounter (Signed)
Palliative care consult rescheduled for 07/04/21 with Dr. Hollace Kinnier. Documentation will be noted in [place].

## 2021-07-03 NOTE — Patient Instructions (Signed)
Opened in error

## 2021-07-04 ENCOUNTER — Other Ambulatory Visit: Payer: Self-pay | Admitting: Internal Medicine

## 2021-07-04 ENCOUNTER — Other Ambulatory Visit: Payer: Self-pay

## 2021-07-17 ENCOUNTER — Other Ambulatory Visit: Payer: Self-pay | Admitting: Nurse Practitioner

## 2021-08-02 DIAGNOSIS — C259 Malignant neoplasm of pancreas, unspecified: Secondary | ICD-10-CM | POA: Insufficient documentation

## 2021-09-29 DEATH — deceased
